# Patient Record
Sex: Male | Born: 1960 | Race: White | Hispanic: No | Marital: Married | Smoking: Former smoker
Health system: Southern US, Community
[De-identification: ages and names within clinical notes are randomized; demographics above are authoritative.]

## PROBLEM LIST (undated history)

## (undated) DIAGNOSIS — E785 Hyperlipidemia, unspecified: Secondary | ICD-10-CM

## (undated) DIAGNOSIS — K219 Gastro-esophageal reflux disease without esophagitis: Secondary | ICD-10-CM

## (undated) DIAGNOSIS — E669 Obesity, unspecified: Secondary | ICD-10-CM

## (undated) DIAGNOSIS — I2510 Atherosclerotic heart disease of native coronary artery without angina pectoris: Secondary | ICD-10-CM

## (undated) DIAGNOSIS — I10 Essential (primary) hypertension: Secondary | ICD-10-CM

## (undated) DIAGNOSIS — I219 Acute myocardial infarction, unspecified: Secondary | ICD-10-CM

## (undated) HISTORY — DX: Hyperlipidemia, unspecified: E78.5

## (undated) HISTORY — PX: CARDIAC CATHETERIZATION: SHX172

## (undated) HISTORY — DX: Essential (primary) hypertension: I10

## (undated) HISTORY — DX: Atherosclerotic heart disease of native coronary artery without angina pectoris: I25.10

## (undated) HISTORY — DX: Acute myocardial infarction, unspecified: I21.9

## (undated) HISTORY — DX: Gastro-esophageal reflux disease without esophagitis: K21.9

## (undated) HISTORY — PX: TONSILLECTOMY: SUR1361

## (undated) HISTORY — DX: Obesity, unspecified: E66.9

---

## 2011-11-01 ENCOUNTER — Ambulatory Visit: Payer: Self-pay | Admitting: Cardiology

## 2011-11-12 ENCOUNTER — Ambulatory Visit: Payer: Self-pay | Admitting: Cardiology

## 2011-11-29 ENCOUNTER — Ambulatory Visit: Payer: Self-pay | Admitting: Cardiology

## 2011-12-17 ENCOUNTER — Encounter: Payer: Self-pay | Admitting: Cardiology

## 2011-12-17 ENCOUNTER — Encounter: Payer: Self-pay | Admitting: *Deleted

## 2011-12-18 ENCOUNTER — Encounter: Payer: Self-pay | Admitting: Cardiology

## 2011-12-18 ENCOUNTER — Other Ambulatory Visit: Payer: Self-pay | Admitting: Cardiology

## 2011-12-18 ENCOUNTER — Ambulatory Visit (INDEPENDENT_AMBULATORY_CARE_PROVIDER_SITE_OTHER): Payer: Managed Care, Other (non HMO) | Admitting: Cardiology

## 2011-12-18 VITALS — BP 140/92 | HR 59 | Ht 68.0 in | Wt 192.0 lb

## 2011-12-18 DIAGNOSIS — I10 Essential (primary) hypertension: Secondary | ICD-10-CM | POA: Insufficient documentation

## 2011-12-18 DIAGNOSIS — E785 Hyperlipidemia, unspecified: Secondary | ICD-10-CM | POA: Insufficient documentation

## 2011-12-18 DIAGNOSIS — E7800 Pure hypercholesterolemia, unspecified: Secondary | ICD-10-CM

## 2011-12-18 DIAGNOSIS — I25810 Atherosclerosis of coronary artery bypass graft(s) without angina pectoris: Secondary | ICD-10-CM | POA: Insufficient documentation

## 2011-12-18 DIAGNOSIS — I2510 Atherosclerotic heart disease of native coronary artery without angina pectoris: Secondary | ICD-10-CM | POA: Insufficient documentation

## 2011-12-18 MED ORDER — ROSUVASTATIN CALCIUM 5 MG PO TABS: 5.0000 mg | ORAL_TABLET | Freq: Every day | ORAL | Status: DC

## 2011-12-18 MED ORDER — RAMIPRIL 5 MG PO CAPS: 5.0000 mg | ORAL_CAPSULE | Freq: Every day | ORAL | Status: DC

## 2011-12-18 NOTE — Assessment & Plan Note (Signed)
Continue statin. Check lipids and liver with goal LDL less than 70.

## 2011-12-18 NOTE — Patient Instructions (Addendum)
Your physician wants you to follow-up in: ONE YEAR WITH DR Jens Som IN [place] You will receive a reminder letter in the mail two months in advance. If you don't receive a letter, please call our office to schedule the follow-up appointment.   STOP PLAVIX  INCREASE RAMAPRIL TO 5 MG ONCE DAILY  Your physician recommends that you return for lab work in: ONE WEEK IN [place]=FASTING

## 2011-12-18 NOTE — Assessment & Plan Note (Signed)
Blood pressure elevated. Increase Altace to 5 mg daily. Check potassium and renal function in one week. Will also check hemoglobin A1c. We have referred to have a primary care physician in this area.

## 2011-12-18 NOTE — Assessment & Plan Note (Signed)
Plan continue aspirin and statin. It has been 5 years since his previous PCI. Discontinue his Plavix. Continue risk factor modification. Plan Myoview when he returns in one year.

## 2011-12-18 NOTE — Progress Notes (Signed)
  HPI: 51 year-old male for evaluation of coronary artery disease. Patient recently moved to this area from [place] and presents to establish. Patient is status post myocardial infarction in February of 2008. Cardiac catheterization at that time showed a normal left main, 30% proximal LAD, 50% mid lesion. The circumflex had a 95% proximal OM. The patient had primary PCI of his right coronary artery. He subsequently had PCI of the marginal. Last Myoview was performed in November of 2011. His ejection fraction was 75% and perfusion was normal. Echocardiogram in November of 2011 showed normal LV function, mild mitral and tricuspid regurgitation and trace aortic insufficiency. Patient denies exertional chest pain. He has dyspnea with more extreme activities but not routine activities. No orthopnea, PND or history of syncope.  Current Outpatient Prescriptions  Medication Sig Dispense Refill  . aspirin 81 MG tablet Take 81 mg by mouth daily.      . carvedilol (COREG) 6.25 MG tablet Take 6.25 mg by mouth 2 (two) times daily with a meal.      . clopidogrel (PLAVIX) 75 MG tablet Take 75 mg by mouth daily.      . nitroGLYCERIN (NITROSTAT) 0.4 MG SL tablet Place 0.4 mg under the tongue every 5 (five) minutes as needed.      . ramipril (ALTACE) 2.5 MG capsule Take 2.5 mg by mouth daily.      . rosuvastatin (CRESTOR) 5 MG tablet Take 5 mg by mouth daily.        No Known Allergies  Past Medical History  Diagnosis Date  . CAD (coronary artery disease)   . Myocardial infarction   . Hyperlipidemia   . Obesity   . GERD (gastroesophageal reflux disease)   . Hypertension     Past Surgical History  Procedure Date  . Cardiac catheterization   . Tonsillectomy     History   Social History  . Marital Status: Married    Spouse Name: N/A    Number of Children: 2  . Years of Education: N/A   Occupational History  .     Social History Main Topics  . Smoking status: Former Games developer  . Smokeless  tobacco: Not on file  . Alcohol Use: Yes     Occasional  . Drug Use: No  . Sexually Active: Not on file   Other Topics Concern  . Not on file   Social History Narrative  . No narrative on file    Family History  Problem Relation Age of Onset  . Coronary artery disease Mother     ROS: no fevers or chills, productive cough, hemoptysis, dysphasia, odynophagia, melena, hematochezia, dysuria, hematuria, rash, seizure activity, orthopnea, PND, pedal edema, claudication. Remaining systems are negative.  Physical Exam:  Blood pressure 140/92, pulse 59, height 5\' 8"  (1.727 m), weight 192 lb (87.091 kg).  General:  Well developed/well nourished in NAD Skin warm/dry Patient not depressed No peripheral clubbing Back-normal HEENT-normal/normal eyelids Neck supple/normal carotid upstroke bilaterally; no bruits; no JVD; no thyromegaly chest - CTA/ normal expansion CV - RRR/normal S1 and S2; no murmurs, rubs or gallops;  PMI nondisplaced Abdomen -NT/ND, no HSM, no mass, + bowel sounds, no bruit 2+ femoral pulses, no bruits Ext-no edema, chords, 2+ DP Neuro-grossly nonfocal  ECG sinus rhythm with occasional PVC. No ST changes.

## 2011-12-20 ENCOUNTER — Ambulatory Visit: Payer: Self-pay | Admitting: Cardiology

## 2012-01-25 LAB — BASIC METABOLIC PANEL WITH GFR
BUN: 15 mg/dL (ref 6–23)
Calcium: 9.3 mg/dL (ref 8.4–10.5)
Creat: 0.78 mg/dL (ref 0.50–1.35)
GFR, Est African American: >89 mL/min
GFR, Est Non African American: >89 mL/min

## 2012-01-25 LAB — HEMOGLOBIN A1C: Hgb A1c MFr Bld: 5.5 % (ref <5.7)

## 2012-01-25 LAB — HEPATIC FUNCTION PANEL
ALT: 19 U/L (ref 0–53)
AST: 22 U/L (ref 0–37)
Bilirubin, Direct: 0.1 mg/dL (ref 0.0–0.3)
Total Protein: 6.2 g/dL (ref 6.0–8.3)

## 2012-01-25 LAB — LIPID PANEL
Cholesterol: 172 mg/dL (ref 0–200)
Triglycerides: 87 mg/dL (ref <150)
VLDL: 17 mg/dL (ref 0–40)

## 2012-01-27 ENCOUNTER — Other Ambulatory Visit: Payer: Self-pay | Admitting: *Deleted

## 2012-01-27 ENCOUNTER — Telehealth: Payer: Self-pay | Admitting: Cardiology

## 2012-01-27 DIAGNOSIS — E7800 Pure hypercholesterolemia, unspecified: Secondary | ICD-10-CM

## 2012-01-27 MED ORDER — ROSUVASTATIN CALCIUM 40 MG PO TABS: 40.0000 mg | ORAL_TABLET | Freq: Every day | ORAL | Status: DC

## 2012-01-27 NOTE — Telephone Encounter (Signed)
Spoke with pt wife, aware of labs and med changes.

## 2012-01-27 NOTE — Telephone Encounter (Signed)
plz return call to pt [phone] regarding medical care

## 2012-02-10 ENCOUNTER — Telehealth: Payer: Self-pay | Admitting: Cardiology

## 2012-02-10 DIAGNOSIS — E7800 Pure hypercholesterolemia, unspecified: Secondary | ICD-10-CM

## 2012-02-10 NOTE — Telephone Encounter (Signed)
New problem:   Need clarification of dosage - crestor.

## 2012-02-11 ENCOUNTER — Telehealth: Payer: Self-pay | Admitting: Cardiology

## 2012-02-11 NOTE — Telephone Encounter (Signed)
Spoke with pt, per dr Jens Som this dosage is correct.

## 2012-02-11 NOTE — Telephone Encounter (Signed)
New Problem:    Patient called in because his Crestor went from 5mg  to 40mg  and wanted to know if it was the right dose.  Please call back.

## 2012-02-11 NOTE — Telephone Encounter (Signed)
Called pharmacy med was increased to  crestor 40 mg

## 2012-02-12 ENCOUNTER — Ambulatory Visit: Payer: Managed Care, Other (non HMO) | Admitting: Internal Medicine

## 2012-04-06 ENCOUNTER — Ambulatory Visit: Payer: Managed Care, Other (non HMO) | Admitting: Internal Medicine

## 2012-04-06 DIAGNOSIS — Z0289 Encounter for other administrative examinations: Secondary | ICD-10-CM

## 2012-05-22 ENCOUNTER — Other Ambulatory Visit: Payer: Self-pay | Admitting: *Deleted

## 2012-05-22 MED ORDER — CARVEDILOL 6.25 MG PO TABS: 6.2500 mg | ORAL_TABLET | Freq: Two times a day (BID) | ORAL | Status: DC

## 2013-01-10 ENCOUNTER — Other Ambulatory Visit: Payer: Self-pay | Admitting: Cardiology

## 2013-02-09 ENCOUNTER — Other Ambulatory Visit: Payer: Self-pay | Admitting: Cardiology

## 2013-02-24 ENCOUNTER — Encounter: Payer: Self-pay | Admitting: Cardiology

## 2013-02-24 ENCOUNTER — Ambulatory Visit (INDEPENDENT_AMBULATORY_CARE_PROVIDER_SITE_OTHER): Payer: Managed Care, Other (non HMO) | Admitting: Cardiology

## 2013-02-24 VITALS — BP 126/80 | HR 72 | Ht 69.0 in | Wt 205.0 lb

## 2013-02-24 DIAGNOSIS — I25810 Atherosclerosis of coronary artery bypass graft(s) without angina pectoris: Secondary | ICD-10-CM

## 2013-02-24 NOTE — Progress Notes (Signed)
      HPI: Fu coronary artery disease. Patient is status post myocardial infarction in February of 2008. Cardiac catheterization at that time showed a normal left main, 30% proximal LAD, 50% mid lesion. The circumflex had a 95% proximal OM. The patient had primary PCI of his right coronary artery. He subsequently had PCI of the marginal. Last Myoview was performed in November of 2011. His ejection fraction was 75% and perfusion was normal. Echocardiogram in November of 2011 showed normal LV function, mild mitral and tricuspid regurgitation and trace aortic insufficiency. Patient last seen in Sept 2013. Since then, the patient denies any dyspnea on exertion, orthopnea, PND, pedal edema, palpitations, syncope or chest pain.    Current Outpatient Prescriptions  Medication Sig Dispense Refill  . aspirin 81 MG tablet Take 81 mg by mouth daily.      . carvedilol (COREG) 6.25 MG tablet Take 1 tablet (6.25 mg total) by mouth 2 (two) times daily with a meal.  60 tablet  12  . CRESTOR 40 MG tablet TAKE 1 TABLET (40 MG TOTAL) BY MOUTH DAILY.  30 tablet  0  . nitroGLYCERIN (NITROSTAT) 0.4 MG SL tablet Place 0.4 mg under the tongue every 5 (five) minutes as needed.      . ramipril (ALTACE) 5 MG capsule TAKE 1 CAPSULE (5 MG TOTAL) BY MOUTH DAILY.  30 capsule  0   No current facility-administered medications for this visit.     Past Medical History  Diagnosis Date  . CAD (coronary artery disease)   . Myocardial infarction   . Hyperlipidemia   . Obesity   . GERD (gastroesophageal reflux disease)   . Hypertension     Past Surgical History  Procedure Laterality Date  . Cardiac catheterization    . Tonsillectomy      History   Social History  . Marital Status: Married    Spouse Name: N/A    Number of Children: 2  . Years of Education: N/A   Occupational History  .     Social History Main Topics  . Smoking status: Former Games developer  . Smokeless tobacco: Not on file  . Alcohol Use: Yes   Comment: Occasional  . Drug Use: No  . Sexual Activity: Not on file   Other Topics Concern  . Not on file   Social History Narrative  . No narrative on file    ROS: no fevers or chills, productive cough, hemoptysis, dysphasia, odynophagia, melena, hematochezia, dysuria, hematuria, rash, seizure activity, orthopnea, PND, pedal edema, claudication. Remaining systems are negative.  Physical Exam: Well-developed well-nourished in no acute distress.  Skin is warm and dry.  HEENT is normal.  Neck is supple.  Chest is clear to auscultation with normal expansion.  Cardiovascular exam is regular rate and rhythm.  Abdominal exam nontender or distended. No masses palpated. Extremities show no edema. neuro grossly intact  ECG sinus rhythm at a rate of 72. No ST changes.

## 2013-02-24 NOTE — Assessment & Plan Note (Signed)
Continue statin. Check lipids and liver. 

## 2013-02-24 NOTE — Assessment & Plan Note (Signed)
Blood pressure controlled. Continue present medications. Check potassium and renal function. I also referred him to primary care for his other medical needs.

## 2013-02-24 NOTE — Assessment & Plan Note (Signed)
Continue aspirin and statin.schedule stress echocardiogram for risk stratification. 

## 2013-02-24 NOTE — Patient Instructions (Signed)
Your physician wants you to follow-up in: ONE YEAR WITH DR Shelda Pal will receive a reminder letter in the mail two months in advance. If you don't receive a letter, please call our office to schedule the follow-up appointment.   Your physician has requested that you have a stress echocardiogram. For further information please visit [email or web address] Please follow instruction sheet as given.   Your physician recommends that you return for lab work WITH STRESS TEST

## 2013-03-12 ENCOUNTER — Other Ambulatory Visit: Payer: Self-pay | Admitting: Cardiology

## 2013-03-14 ENCOUNTER — Other Ambulatory Visit: Payer: Self-pay | Admitting: Cardiology

## 2013-04-02 ENCOUNTER — Other Ambulatory Visit (HOSPITAL_COMMUNITY): Payer: Managed Care, Other (non HMO)

## 2013-05-31 ENCOUNTER — Encounter: Payer: Self-pay | Admitting: Cardiology

## 2013-06-09 ENCOUNTER — Other Ambulatory Visit: Payer: Self-pay | Admitting: Cardiology

## 2013-06-23 ENCOUNTER — Telehealth: Payer: Self-pay | Admitting: Cardiology

## 2013-06-23 DIAGNOSIS — R079 Chest pain, unspecified: Secondary | ICD-10-CM

## 2013-06-23 NOTE — Telephone Encounter (Signed)
New message     Patient wife calling wanted to R/ S stress echo test that was cancel on  1/9 .  Need a new order submit

## 2013-06-23 NOTE — Telephone Encounter (Signed)
There is already an order placed. Please call pt to reschedule

## 2013-07-12 ENCOUNTER — Other Ambulatory Visit (HOSPITAL_COMMUNITY): Payer: Managed Care, Other (non HMO)

## 2013-10-18 ENCOUNTER — Telehealth (HOSPITAL_COMMUNITY): Payer: Self-pay | Admitting: *Deleted

## 2013-11-02 ENCOUNTER — Telehealth (HOSPITAL_COMMUNITY): Payer: Self-pay | Admitting: *Deleted

## 2013-11-03 ENCOUNTER — Telehealth (HOSPITAL_COMMUNITY): Payer: Self-pay | Admitting: *Deleted

## 2013-11-25 ENCOUNTER — Other Ambulatory Visit (HOSPITAL_COMMUNITY): Payer: Managed Care, Other (non HMO)

## 2013-12-16 ENCOUNTER — Ambulatory Visit (HOSPITAL_COMMUNITY): Payer: Managed Care, Other (non HMO) | Attending: Cardiology | Admitting: Radiology

## 2013-12-16 DIAGNOSIS — I10 Essential (primary) hypertension: Secondary | ICD-10-CM | POA: Diagnosis not present

## 2013-12-16 DIAGNOSIS — I2510 Atherosclerotic heart disease of native coronary artery without angina pectoris: Secondary | ICD-10-CM | POA: Diagnosis not present

## 2013-12-16 DIAGNOSIS — I25810 Atherosclerosis of coronary artery bypass graft(s) without angina pectoris: Secondary | ICD-10-CM

## 2013-12-16 DIAGNOSIS — E785 Hyperlipidemia, unspecified: Secondary | ICD-10-CM | POA: Insufficient documentation

## 2013-12-16 NOTE — Progress Notes (Signed)
Stress Echocardiogram performed.  

## 2013-12-17 ENCOUNTER — Encounter: Payer: Self-pay | Admitting: Cardiology

## 2013-12-17 ENCOUNTER — Telehealth: Payer: Self-pay | Admitting: Cardiology

## 2013-12-17 NOTE — Telephone Encounter (Signed)
Pt received a call  From somebody this morning concerning his test results.He said his wife took the call,he does not know who called.

## 2013-12-17 NOTE — Telephone Encounter (Signed)
New message    Wife calling patient had stress done on yesterday .   appt on 9/28 @ 2:45 pm.     Or should he wait until Oct  25

## 2013-12-17 NOTE — Telephone Encounter (Signed)
Spoke with pt wife, questions regarding test answered.

## 2013-12-17 NOTE — Telephone Encounter (Signed)
This encounter was created in error - please disregard.

## 2013-12-20 ENCOUNTER — Encounter: Payer: Self-pay | Admitting: Cardiology

## 2013-12-20 ENCOUNTER — Ambulatory Visit (INDEPENDENT_AMBULATORY_CARE_PROVIDER_SITE_OTHER): Payer: Managed Care, Other (non HMO) | Admitting: Cardiology

## 2013-12-20 VITALS — BP 130/80 | HR 62 | Ht 69.0 in | Wt 208.3 lb

## 2013-12-20 DIAGNOSIS — I10 Essential (primary) hypertension: Secondary | ICD-10-CM

## 2013-12-20 DIAGNOSIS — E785 Hyperlipidemia, unspecified: Secondary | ICD-10-CM

## 2013-12-20 DIAGNOSIS — I25810 Atherosclerosis of coronary artery bypass graft(s) without angina pectoris: Secondary | ICD-10-CM

## 2013-12-20 NOTE — Assessment & Plan Note (Signed)
Continue statin. Check lipids and liver. 

## 2013-12-20 NOTE — Patient Instructions (Signed)
Your physician wants you to follow-up in: ONE YEAR WITH DR CRENSHAW You will receive a reminder letter in the mail two months in advance. If you don't receive a letter, please call our office to schedule the follow-up appointment.   Your physician recommends that you return for lab work WHEN FASTING 

## 2013-12-20 NOTE — Progress Notes (Signed)
      HPI: Fu coronary artery disease. Patient is status post myocardial infarction in February of 2008. Cardiac catheterization at that time showed a normal left main, 30% proximal LAD, 50% mid lesion. The circumflex had a 95% proximal OM. The patient had primary PCI of his right coronary artery. He subsequently had PCI of the marginal. Last Myoview was performed in November of 2011. His ejection fraction was 75% and perfusion was normal. Echocardiogram in November of 2011 showed normal LV function, mild mitral and tricuspid regurgitation and trace aortic insufficiency. Stress echocardiogram September 2015 was technically difficult. No obvious wall motion abnormalities but ECG abnormal. Since last seen, the patient denies any dyspnea on exertion, orthopnea, PND, pedal edema, palpitations, syncope or chest pain. Occasional brief pain with stress lasting seconds.    Current Outpatient Prescriptions  Medication Sig Dispense Refill  . aspirin 81 MG tablet Take 81 mg by mouth daily.      . carvedilol (COREG) 6.25 MG tablet TAKE ONE TABLET BY MOUTH TWICE DAILY WITH MEALS  60 tablet  6  . CRESTOR 40 MG tablet TAKE 1 TABLET (40 MG TOTAL) BY MOUTH DAILY. NEED APPOINTMENT FOR MORE REFILLS  30 tablet  11  . nitroGLYCERIN (NITROSTAT) 0.4 MG SL tablet Place 0.4 mg under the tongue every 5 (five) minutes as needed.      . ramipril (ALTACE) 5 MG capsule TAKE 1 CAPSULE (5 MG TOTAL) BY MOUTH DAILY. NEED APPOINTMENT FOR MORE REFILLS  30 capsule  11   No current facility-administered medications for this visit.     Past Medical History  Diagnosis Date  . CAD (coronary artery disease)   . Myocardial infarction   . Hyperlipidemia   . Obesity   . GERD (gastroesophageal reflux disease)   . Hypertension     Past Surgical History  Procedure Laterality Date  . Cardiac catheterization    . Tonsillectomy      History   Social History  . Marital Status: Married    Spouse Name: N/A    Number of Children:  2  . Years of Education: N/A   Occupational History  .     Social History Main Topics  . Smoking status: Former Games developer  . Smokeless tobacco: Not on file  . Alcohol Use: Yes     Comment: Occasional  . Drug Use: No  . Sexual Activity: Not on file   Other Topics Concern  . Not on file   Social History Narrative  . No narrative on file    ROS: no fevers or chills, productive cough, hemoptysis, dysphasia, odynophagia, melena, hematochezia, dysuria, hematuria, rash, seizure activity, orthopnea, PND, pedal edema, claudication. Remaining systems are negative.  Physical Exam: Well-developed well-nourished in no acute distress.  Skin is warm and dry.  HEENT is normal.  Neck is supple.  Chest is clear to auscultation with normal expansion.  Cardiovascular exam is regular rate and rhythm.  Abdominal exam nontender or distended. No masses palpated. Extremities show no edema. neuro grossly intact  ECG Sinus rhythm at a rate of 62. No ST changes.

## 2013-12-20 NOTE — Assessment & Plan Note (Signed)
Blood pressure controlled. Continue present medications. Check potassium and renal function. 

## 2013-12-20 NOTE — Assessment & Plan Note (Signed)
Stress echocardiogram showed difficult images but no obvious abnormalities. It is not have exertional symptoms. Plan medical therapy. Continue aspirin and statin.

## 2014-04-11 ENCOUNTER — Other Ambulatory Visit: Payer: Self-pay | Admitting: Cardiology

## 2015-04-07 ENCOUNTER — Emergency Department (HOSPITAL_COMMUNITY)
Admission: EM | Admit: 2015-04-07 | Discharge: 2015-04-07 | Disposition: A | Discharge status: Home / Self Care | Payer: Managed Care, Other (non HMO) | Attending: Emergency Medicine | Admitting: Emergency Medicine

## 2015-04-07 ENCOUNTER — Encounter (HOSPITAL_COMMUNITY): Payer: Self-pay | Admitting: Emergency Medicine

## 2015-04-07 ENCOUNTER — Emergency Department (HOSPITAL_COMMUNITY): Payer: Managed Care, Other (non HMO)

## 2015-04-07 DIAGNOSIS — M79602 Pain in left arm: Secondary | ICD-10-CM | POA: Diagnosis not present

## 2015-04-07 DIAGNOSIS — I2510 Atherosclerotic heart disease of native coronary artery without angina pectoris: Secondary | ICD-10-CM | POA: Diagnosis not present

## 2015-04-07 DIAGNOSIS — I252 Old myocardial infarction: Secondary | ICD-10-CM | POA: Insufficient documentation

## 2015-04-07 DIAGNOSIS — E669 Obesity, unspecified: Secondary | ICD-10-CM | POA: Diagnosis not present

## 2015-04-07 DIAGNOSIS — Z7982 Long term (current) use of aspirin: Secondary | ICD-10-CM | POA: Insufficient documentation

## 2015-04-07 DIAGNOSIS — R202 Paresthesia of skin: Secondary | ICD-10-CM | POA: Diagnosis present

## 2015-04-07 DIAGNOSIS — E785 Hyperlipidemia, unspecified: Secondary | ICD-10-CM | POA: Diagnosis not present

## 2015-04-07 DIAGNOSIS — Z8719 Personal history of other diseases of the digestive system: Secondary | ICD-10-CM | POA: Diagnosis not present

## 2015-04-07 DIAGNOSIS — Z79899 Other long term (current) drug therapy: Secondary | ICD-10-CM | POA: Insufficient documentation

## 2015-04-07 DIAGNOSIS — I10 Essential (primary) hypertension: Secondary | ICD-10-CM | POA: Diagnosis not present

## 2015-04-07 LAB — BASIC METABOLIC PANEL
Anion gap: 12 (ref 5–15)
BUN: 14 mg/dL (ref 6–20)
CHLORIDE: 103 mmol/L (ref 101–111)
CO2: 27 mmol/L (ref 22–32)
CREATININE: 0.93 mg/dL (ref 0.61–1.24)
Calcium: 9.6 mg/dL (ref 8.9–10.3)
Glucose, Bld: 126 mg/dL — ABNORMAL HIGH (ref 65–99)
POTASSIUM: 3.9 mmol/L (ref 3.5–5.1)
SODIUM: 142 mmol/L (ref 135–145)

## 2015-04-07 LAB — CBC
HCT: 42.2 % (ref 39.0–52.0)
Hemoglobin: 14.5 g/dL (ref 13.0–17.0)
MCH: 28.2 pg (ref 26.0–34.0)
MCHC: 34.4 g/dL (ref 30.0–36.0)
MCV: 81.9 fL (ref 78.0–100.0)
Platelets: 217 10*3/uL (ref 150–400)
RBC: 5.15 MIL/uL (ref 4.22–5.81)
RDW: 13.7 % (ref 11.5–15.5)
WBC: 9.1 10*3/uL (ref 4.0–10.5)

## 2015-04-07 LAB — I-STAT TROPONIN, ED: Troponin i, poc: 0.01 ng/mL (ref 0.00–0.08)

## 2015-04-07 LAB — TROPONIN I

## 2015-04-07 NOTE — ED Provider Notes (Signed)
CSN: [id number]     Arrival date & time 04/07/15  0407 History   First MD Initiated Contact with Patient 04/07/15 [phone]     Chief Complaint  Patient presents with  . Chest Pain  . Arm Pain     (Consider location/radiation/quality/duration/timing/severity/associated sxs/prior Treatment) Patient is a 55 y.o. male presenting with chest pain and arm pain. The history is provided by the patient (Patient states that he woke up at 2 AM this morning had some left arm pain may be some left chest pain took a nitroglycerin and the pain went away after 2 hours.).  Chest Pain Pain location:  L chest Pain quality: aching   Pain radiates to:  Does not radiate Pain radiates to the back: no   Pain severity:  No pain Onset quality:  Sudden Timing:  Intermittent Progression:  Resolved Chronicity:  New Context: not breathing   Associated symptoms: no abdominal pain, no back pain, no cough, no fatigue and no headache   Arm Pain Associated symptoms include chest pain. Pertinent negatives include no abdominal pain and no headaches.    Past Medical History  Diagnosis Date  . CAD (coronary artery disease)   . Myocardial infarction (HCC)   . Hyperlipidemia   . Obesity   . GERD (gastroesophageal reflux disease)   . Hypertension    Past Surgical History  Procedure Laterality Date  . Cardiac catheterization    . Tonsillectomy     Family History  Problem Relation Age of Onset  . Coronary artery disease Mother    Social History  Substance Use Topics  . Smoking status: Former Games developer  . Smokeless tobacco: None  . Alcohol Use: Yes     Comment: Occasional    Review of Systems  Constitutional: Negative for appetite change and fatigue.  HENT: Negative for congestion, ear discharge and sinus pressure.   Eyes: Negative for discharge.  Respiratory: Negative for cough.   Cardiovascular: Positive for chest pain.  Gastrointestinal: Negative for abdominal pain and diarrhea.  Genitourinary: Negative  for frequency and hematuria.  Musculoskeletal: Negative for back pain.  Skin: Negative for rash.  Neurological: Negative for seizures and headaches.  Psychiatric/Behavioral: Negative for hallucinations.      Allergies  Review of patient's allergies indicates no known allergies.  Home Medications   Prior to Admission medications   Medication Sig Start Date End Date Taking? Authorizing Provider  aspirin 81 MG tablet Take 81 mg by mouth daily.   Yes Historical Provider, MD  carvedilol (COREG) 6.25 MG tablet TAKE ONE TABLET BY MOUTH TWICE DAILY WITH MEALS 06/09/13  Yes Lewayne Bunting, MD  CRESTOR 40 MG tablet TAKE 1 TABLET (40 MG TOTAL) BY MOUTH DAILY. 04/12/14  Yes Lewayne Bunting, MD  escitalopram (LEXAPRO) 10 MG tablet Take 10 mg by mouth daily.   Yes Historical Provider, MD  Multiple Vitamin (MULTIVITAMIN WITH MINERALS) TABS tablet Take 1 tablet by mouth daily.   Yes Historical Provider, MD  omega-3 acid ethyl esters (LOVAZA) 1 g capsule Take 1 g by mouth daily.   Yes Historical Provider, MD  ramipril (ALTACE) 5 MG capsule TAKE 1 CAPSULE (5 MG TOTAL) BY MOUTH DAILY. NEED APPOINTMENT FOR MORE REFILLS 03/14/13  Yes Lewayne Bunting, MD  nitroGLYCERIN (NITROSTAT) 0.4 MG SL tablet Place 0.4 mg under the tongue every 5 (five) minutes as needed.    Historical Provider, MD   BP 148/94 mmHg  Pulse 63  Temp(Src) 97.8 F (36.6 C) (Oral)  Resp 18  Ht 5\' 8"  (1.727 m)  Wt 192 lb (87.091 kg)  BMI 29.20 kg/m2  SpO2 99% Physical Exam  Constitutional: He is oriented to person, place, and time. He appears well-developed.  HENT:  Head: Normocephalic.  Eyes: Conjunctivae and EOM are normal. No scleral icterus.  Neck: Neck supple. No thyromegaly present.  Cardiovascular: Normal rate and regular rhythm.  Exam reveals no gallop and no friction rub.   No murmur heard. Pulmonary/Chest: No stridor. He has no wheezes. He has no rales. He exhibits no tenderness.  Abdominal: He exhibits no distension.  There is no tenderness. There is no rebound.  Musculoskeletal: Normal range of motion. He exhibits no edema.  Lymphadenopathy:    He has no cervical adenopathy.  Neurological: He is oriented to person, place, and time. He exhibits normal muscle tone. Coordination normal.  Skin: No rash noted. No erythema.  Psychiatric: He has a normal mood and affect. His behavior is normal.    ED Course  Procedures (including critical care time) Labs Review Labs Reviewed  BASIC METABOLIC PANEL - Abnormal; Notable for the following:    Glucose, Bld 126 (*)    All other components within normal limits  CBC  TROPONIN I  Rosezena SensorI-STAT TROPOININ, ED    Imaging Review Dg Chest 2 View  04/07/2015  CLINICAL DATA:  55 year old male with chest pain and left arm pain. EXAM: CHEST  2 VIEW COMPARISON:  None. FINDINGS: The heart size and mediastinal contours are within normal limits. Both lungs are clear. The visualized skeletal structures are unremarkable. IMPRESSION: No active cardiopulmonary disease. Electronically Signed   By: Elgie CollardArash  Radparvar M.D.   On: 04/07/2015 06:17   I have personally reviewed and evaluated these images and lab results as part of my medical decision-making.   EKG Interpretation   Date/Time:  Friday April 07 2015 04:07:35 EST Ventricular Rate:  68 PR Interval:  164 QRS Duration: 96 QT Interval:  412 QTC Calculation: 438 R Axis:   45 Text Interpretation:  Normal sinus rhythm Normal ECG Confirmed by Pruitt Taboada   MD, Karelly Dewalt ([id number]) on 04/07/2015 7:08:19 AM      MDM   Final diagnoses:  Pain of left upper extremity    Patient was seen by cardiology it was felt that his discomfort was not cardiac related he will be followed up at the cardiology clinic. Arm pain probably secondary to musculoskeletal injury from changing tires the day before    Bethann BerkshireJoseph Fard Borunda, MD 04/07/15 1054

## 2015-04-07 NOTE — H&P (Signed)
Cardiologist: Aaron Blackburn is an 55 y.o. male.   Chief Complaint: Chest Pain HPI:   Patient is a 55 year old male with history of coronary disease, hyperlipidemia, obesity, GERD, hypertension.  We saw him last in the clinic September 2015.  He is status post myocardial infarction in February of 2008. Cardiac catheterization at that time showed a normal left main, 30% proximal LAD, 50% mid lesion. The circumflex had a 95% proximal OM. The patient had primary PCI of his right coronary artery. He subsequently had PCI of the marginal. Last Myoview was performed in November of 2011. His ejection fraction was 75% and perfusion was normal. Echocardiogram in November of 2011 showed normal LV function, mild mitral and tricuspid regurgitation and trace aortic insufficiency. Stress echocardiogram September 2015 was technically difficult. No obvious wall motion abnormalities but ECG abnormal.  Aaron Blackburn presents with Paresthesia and numbness in his left arm.  Patient reports he was already awake at 0200 hrs. this morning and noticed he was having numbness and tingling in the left upper extremity. He denies having chest pain. The sensation resolved approximately 30 minutes later. Reports laying on the ground yesterday having to change a tire. 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 Troponins negative 2 in no acute EKG changes.   Past Medical History  Diagnosis Date  . CAD (coronary artery disease)   . Myocardial infarction ([place])   . Hyperlipidemia   . Obesity   . GERD (gastroesophageal reflux disease)   . Hypertension     Past Surgical History  Procedure Laterality Date  . Cardiac catheterization    . Tonsillectomy      Family History  Problem Relation Age of Onset  . Coronary artery disease Mother    Social History:  reports that he has quit smoking. He does not have any smokeless tobacco history on file. He reports that he drinks alcohol. He reports that he  does not use illicit drugs.  Allergies: No Known Allergies   (Not in a hospital admission)  Results for orders placed or performed during the hospital encounter of 04/07/15 (from the past 48 hour(s))  Basic metabolic panel     Status: Abnormal   Collection Time: 04/07/15  4:20 AM  Result Value Ref Range   Sodium 142 135 - 145 mmol/L   Potassium 3.9 3.5 - 5.1 mmol/L   Chloride 103 101 - 111 mmol/L   CO2 27 22 - 32 mmol/L   Glucose, Bld 126 (H) 65 - 99 mg/dL   BUN 14 6 - 20 mg/dL   Creatinine, Ser 0.93 0.61 - 1.24 mg/dL   Calcium 9.6 8.9 - 10.3 mg/dL   GFR calc non Af Amer >60 >60 mL/min   GFR calc Af Amer >60 >60 mL/min    Comment: (NOTE) The eGFR has been calculated using the CKD EPI equation. This calculation has not been validated in all clinical situations. eGFR's persistently <60 mL/min signify possible Chronic Kidney Disease.    Anion gap 12 5 - 15  CBC     Status: None   Collection Time: 04/07/15  4:20 AM  Result Value Ref Range   WBC 9.1 4.0 - 10.5 K/uL   RBC 5.15 4.22 - 5.81 MIL/uL   Hemoglobin 14.5 13.0 - 17.0 g/dL   HCT 42.2 39.0 - 52.0 %   MCV 81.9 78.0 - 100.0 fL   MCH 28.2 26.0 - 34.0 pg   MCHC 34.4 30.0 - 36.0 g/dL  RDW 13.7 11.5 - 15.5 %   Platelets 217 150 - 400 K/uL  I-stat troponin, ED (not at [facility])     Status: None   Collection Time: 04/07/15  4:26 AM  Result Value Ref Range   Troponin i, poc 0.01 0.00 - 0.08 ng/mL   Comment 3            Comment: Due to the release kinetics of cTnI, a negative result within the first hours of the onset of symptoms does not rule out myocardial infarction with certainty. If myocardial infarction is still suspected, repeat the test at appropriate intervals.   Troponin I     Status: None   Collection Time: 04/07/15  7:26 AM  Result Value Ref Range   Troponin I <0.03 <0.031 ng/mL    Comment:        NO INDICATION OF MYOCARDIAL INJURY.    Dg Chest 2 View  04/07/2015  CLINICAL DATA:  55 year old male with  chest pain and left arm pain. EXAM: CHEST  2 VIEW COMPARISON:  None. FINDINGS: The heart size and mediastinal contours are within normal limits. Both lungs are clear. The visualized skeletal structures are unremarkable. IMPRESSION: No active cardiopulmonary disease. Electronically Signed   By: Anner Crete M.D.   On: 04/07/2015 06:17    Review of Systems  Constitutional: Negative for fever.  HENT: Negative for congestion and sore throat.   Respiratory: Negative for cough and shortness of breath.   Cardiovascular: Negative for chest pain, orthopnea, leg swelling and PND.  Gastrointestinal: Negative for nausea, vomiting, abdominal pain, blood in stool and melena.  Neurological: Positive for dizziness and tingling. Negative for weakness.       Numbness left arm  All other systems reviewed and are negative.   Blood pressure 148/94, pulse 63, temperature 97.8 F (36.6 C), temperature source Oral, resp. rate 18, height _0  (1.727 m), weight 192 lb (87.091 kg), SpO2 99 %. Physical Exam  Nursing note and vitals reviewed. Constitutional: He is oriented to person, place, and time. He appears well-developed and well-nourished. No distress.  HENT:  Head: Normocephalic and atraumatic.  Eyes: EOM are normal. Pupils are equal, round, and reactive to light. No scleral icterus.  Neck: Normal range of motion. Neck supple.  Cardiovascular: Normal rate, regular rhythm, S1 normal and S2 normal.   No murmur heard. Pulses:      Radial pulses are 2+ on the right side, and 2+ on the left side.       Dorsalis pedis pulses are 2+ on the right side, and 2+ on the left side.  Respiratory: Effort normal and breath sounds normal. He has no wheezes. He has no rales.  GI: Soft. Bowel sounds are normal. There is no tenderness.  Musculoskeletal: He exhibits no edema or tenderness.  No cervical spine tenderness.  Lymphadenopathy:    He has no cervical adenopathy.  Neurological: He is alert and oriented to  person, place, and time. He exhibits normal muscle tone.  Strength 5 out of 5 in the upper extremities.  Skin: Skin is warm and dry.  Psychiatric: He has a normal mood and affect.     Assessment/Plan Active Problems:   Paresthesia of left arm   55 year old male with history of coronary disease, hyperlipidemia, obesity, GERD, hypertension.  Presents with paresthesia and numbness in the left arm.  He did tell the ER doc was having mild left lateral chest pain.  EKG was normal.  Troponins negative 2. This  is noncardiac in nature.  This could just be the aftereffects of changing in a tire and lying on the ground along with a history of cervical spine osteoarthritis.  Symptoms have resolved. Will discharge home from the ER. They've arranged appointment with Dr. Stanford Breed on March 14 at 3:00.  The patient will call the office if he needs to be seen any sooner.  Tarri Fuller, [place] 04/07/2015, 10:21 AM

## 2015-04-07 NOTE — Discharge Instructions (Signed)
Follow-up with your cardiologist next week as planned 

## 2015-04-07 NOTE — ED Notes (Signed)
Patient with neck pain, shoulder pain and left arm pain.  Patient states that it went away after taking one nitro, then had a heaviness feeling.  Patient states that when he had his MI, he had the numbness in his neck and numbness in left arm.  Patient is CAOx4 in triage.

## 2015-05-30 NOTE — Progress Notes (Signed)
      HPI: Fu coronary artery disease. Patient is status post myocardial infarction in February of 2008. Cardiac catheterization at that time showed a normal left main, 30% proximal LAD, 50% mid lesion. The circumflex had a 95% proximal OM. The patient had primary PCI of his right coronary artery. He subsequently had PCI of the marginal. Last Myoview was performed in November of 2011. His ejection fraction was 75% and perfusion was normal. Echocardiogram in November of 2011 showed normal LV function, mild mitral and tricuspid regurgitation and trace aortic insufficiency. Stress echocardiogram September 2015 was technically difficult. No obvious wall motion abnormalities but ECG abnormal. Patient seen in the emergency room 2017 with left upper extremity paresthesia. Troponin negative. Since last seen, the patient denies any dyspnea on exertion, orthopnea, PND, pedal edema, palpitations, syncope or chest pain.   Current Outpatient Prescriptions  Medication Sig Dispense Refill  . aspirin 81 MG tablet Take 81 mg by mouth daily.    . carvedilol (COREG) 6.25 MG tablet TAKE ONE TABLET BY MOUTH TWICE DAILY WITH MEALS 60 tablet 6  . CRESTOR 40 MG tablet TAKE 1 TABLET (40 MG TOTAL) BY MOUTH DAILY. 30 tablet 10  . escitalopram (LEXAPRO) 10 MG tablet Take 10 mg by mouth daily.    . Multiple Vitamin (MULTIVITAMIN WITH MINERALS) TABS tablet Take 1 tablet by mouth daily.    . nitroGLYCERIN (NITROSTAT) 0.4 MG SL tablet Place 0.4 mg under the tongue every 5 (five) minutes as needed.    Marland Kitchen. omega-3 acid ethyl esters (LOVAZA) 1 g capsule Take 1 g by mouth daily.    . ramipril (ALTACE) 5 MG capsule TAKE 1 CAPSULE (5 MG TOTAL) BY MOUTH DAILY. NEED APPOINTMENT FOR MORE REFILLS 30 capsule 11   No current facility-administered medications for this visit.     Past Medical History  Diagnosis Date  . CAD (coronary artery disease)   . Myocardial infarction ([facility])   . Hyperlipidemia   . Obesity   . GERD  (gastroesophageal reflux disease)   . Hypertension     Past Surgical History  Procedure Laterality Date  . Cardiac catheterization    . Tonsillectomy      Social History   Social History  . Marital Status: Married    Spouse Name: N/A  . Number of Children: 2  . Years of Education: N/A   Occupational History  .     Social History Main Topics  . Smoking status: Former Games developermoker  . Smokeless tobacco: Not on file  . Alcohol Use: Yes     Comment: Occasional  . Drug Use: No  . Sexual Activity: Not on file   Other Topics Concern  . Not on file   Social History Narrative    Family History  Problem Relation Age of Onset  . Coronary artery disease Mother     ROS: no fevers or chills, productive cough, hemoptysis, dysphasia, odynophagia, melena, hematochezia, dysuria, hematuria, rash, seizure activity, orthopnea, PND, pedal edema, claudication. Remaining systems are negative.  Physical Exam: Well-developed well-nourished in no acute distress.  Skin is warm and dry.  HEENT is normal.  Neck is supple.  Chest is clear to auscultation with normal expansion.  Cardiovascular exam is regular rate and rhythm.  Abdominal exam nontender or distended. No masses palpated. Extremities show no edema. neuro grossly intact  ECG Sinus rhythm at a rate of 62. No ST changes.

## 2015-06-06 ENCOUNTER — Ambulatory Visit (INDEPENDENT_AMBULATORY_CARE_PROVIDER_SITE_OTHER): Payer: Managed Care, Other (non HMO) | Admitting: Cardiology

## 2015-06-06 ENCOUNTER — Encounter: Payer: Self-pay | Admitting: Cardiology

## 2015-06-06 VITALS — BP 148/102 | HR 60 | Ht 68.0 in | Wt 195.0 lb

## 2015-06-06 DIAGNOSIS — E785 Hyperlipidemia, unspecified: Secondary | ICD-10-CM | POA: Diagnosis not present

## 2015-06-06 MED ORDER — RAMIPRIL 10 MG PO CAPS: 10.0000 mg | ORAL_CAPSULE | Freq: Every day | ORAL | Status: DC

## 2015-06-06 NOTE — Assessment & Plan Note (Signed)
Continue statin. Check lipids and liver. 

## 2015-06-06 NOTE — Patient Instructions (Signed)
Medication Instructions:   INCREASE RAMAPRIL TO 10 MG ONCE DAILY= 2 OF THE 5 MG TABLETS ONCE DAILY  Labwork:  Your physician recommends that you return for lab work in: ONE WEEK=DO NOT EAT PRIOR TO LAB WORK  Follow-Up:  Your physician wants you to follow-up in: ONE YEAR WITH DR Shelda PalRENSHAW You will receive a reminder letter in the mail two months in advance. If you don't receive a letter, please call our office to schedule the follow-up appointment.

## 2015-06-06 NOTE — Assessment & Plan Note (Signed)
Continue aspirin and statin. 

## 2015-06-06 NOTE — Assessment & Plan Note (Signed)
Blood pressure elevated. Increase Altace to 10 mg daily. In 1 week check potassium and renal function. Monitor blood pressure at home and advance medications as needed.

## 2015-06-16 ENCOUNTER — Telehealth: Payer: Self-pay | Admitting: Cardiology

## 2015-06-16 ENCOUNTER — Telehealth: Payer: Self-pay | Admitting: General Practice

## 2015-06-16 NOTE — Telephone Encounter (Signed)
No answer

## 2015-06-16 NOTE — Telephone Encounter (Signed)
Duplicate encounter

## 2015-06-16 NOTE — Telephone Encounter (Signed)
Pt's wife is calling in to give BP readings. 2 of them are from the home device that purchased and one is from the monitor at [facility] that he [facility].    3/21 126/86 HT  3/23-156/103 home device          147/103

## 2015-06-21 NOTE — Telephone Encounter (Signed)
Pt wife stated that pt has been checking BP at [facility] and it has been more normal and along with his home machine readings. Pt wants to cancel appt for Friday said she will fax this weeks reading to the office to review and will setup appt is finds there are abnormal readings in future.

## 2015-06-21 NOTE — Telephone Encounter (Signed)
New message      Pt is due to come in Friday 06-23-15 to see the nurse and bring his bp machine.  Wife says he has been checking his bp at home and comparing it to the bp machine at [facility] and it is the same.  Should he keep his Friday appt?

## 2015-06-22 ENCOUNTER — Other Ambulatory Visit: Payer: Self-pay | Admitting: *Deleted

## 2015-06-22 ENCOUNTER — Telehealth: Payer: Self-pay | Admitting: Cardiology

## 2015-06-22 MED ORDER — RAMIPRIL 10 MG PO CAPS: 10.0000 mg | ORAL_CAPSULE | Freq: Every day | ORAL | Status: DC

## 2015-06-22 MED ORDER — RAMIPRIL 10 MG PO CAPS: 10.0000 mg | ORAL_CAPSULE | Freq: Two times a day (BID) | ORAL | Status: DC

## 2015-06-22 NOTE — Telephone Encounter (Signed)
Patients wife stated that the pharmacy never received the ramipril rx for the new dose.

## 2015-06-22 NOTE — Telephone Encounter (Signed)
REFILL 

## 2015-06-22 NOTE — Telephone Encounter (Signed)
90 capsule 3 06/22/2015   Refilled by C. Clinton SawyerWilliamson, CMA

## 2015-06-22 NOTE — Telephone Encounter (Signed)
°*  STAT* If patient is at the pharmacy, call can be transferred to refill team.   1. Which medications need to be refilled? (please list name of each medication and dose if known) Rampril  10mg  -- Needs a new Prescription   2. Which pharmacy/location (including street and city if local pharmacy) is medication to be sent to?[facility] on [address]   3. Do they need a 30 day or 90 day supply? 90

## 2015-06-23 ENCOUNTER — Ambulatory Visit: Payer: Managed Care, Other (non HMO)

## 2015-06-23 ENCOUNTER — Other Ambulatory Visit: Payer: Self-pay | Admitting: *Deleted

## 2015-06-23 MED ORDER — RAMIPRIL 10 MG PO CAPS: 10.0000 mg | ORAL_CAPSULE | Freq: Two times a day (BID) | ORAL | Status: DC

## 2015-06-28 ENCOUNTER — Telehealth: Payer: Self-pay | Admitting: Cardiology

## 2015-06-28 NOTE — Telephone Encounter (Signed)
Pt's wife faxed BP readings on Monday  on 06-26-15  , did you receive? And plan from here?  [phone]

## 2015-06-28 NOTE — Telephone Encounter (Signed)
Left message for pt to call.

## 2015-06-29 NOTE — Telephone Encounter (Signed)
Pt's wife is calling back to speaking to a nurse about the pt's blood pressure. She says that she faxed over his readings on Monday and has not heard anything

## 2015-06-29 NOTE — Telephone Encounter (Signed)
Follow Up ° °Pt returned call//  °

## 2015-06-29 NOTE — Telephone Encounter (Signed)
Follow up ° ° ° ° ° °Returning a call to the nurse °

## 2015-06-29 NOTE — Telephone Encounter (Signed)
Returned call to patient's wife.She stated she faxed husband's B/P readings on 06/26/15 for Dr.Crenshaw's review.She was calling back to see if he had reviewed readings.Advised Dr.Crenshaw out of office.I will send message to his nurse Stanton KidneyDebra.She stated they will be traveling tomorrow please call her cell phone [phone].

## 2015-06-30 MED ORDER — CARVEDILOL 12.5 MG PO TABS: 12.5000 mg | ORAL_TABLET | Freq: Two times a day (BID) | ORAL | Status: DC

## 2015-06-30 NOTE — Telephone Encounter (Signed)
Change coreg to 12.5 BID Finneas Mathe  

## 2015-06-30 NOTE — Telephone Encounter (Signed)
Follow up      Patient returning the call back to nurse

## 2015-06-30 NOTE — Telephone Encounter (Signed)
Spoke with pt, Aware of dr crenshaw's recommendations. New script sent to the pharmacy  

## 2015-06-30 NOTE — Telephone Encounter (Signed)
Spoke with pt, his bp is running [phone] for the last 2 weeks.  Medications confirmed. Will forward for dr Jens Somcrenshaw review

## 2015-07-11 ENCOUNTER — Encounter: Payer: Self-pay | Admitting: *Deleted

## 2015-07-11 LAB — LIPID PANEL
CHOL/HDL RATIO: 4 ratio (ref <=5.0)
CHOLESTEROL: 109 mg/dL — AB (ref 125–200)
HDL: 27 mg/dL — ABNORMAL LOW (ref >=40)
LDL CALC: 59 mg/dL (ref <130)
Triglycerides: 117 mg/dL (ref <150)
VLDL: 23 mg/dL (ref <30)

## 2015-07-11 LAB — HEPATIC FUNCTION PANEL
ALT: 15 U/L (ref 9–46)
AST: 21 U/L (ref 10–35)
Albumin: 4.4 g/dL (ref 3.6–5.1)
Alkaline Phosphatase: 56 U/L (ref 40–115)
BILIRUBIN DIRECT: 0.1 mg/dL (ref <=0.2)
BILIRUBIN INDIRECT: 0.4 mg/dL (ref 0.2–1.2)
BILIRUBIN TOTAL: 0.5 mg/dL (ref 0.2–1.2)
Total Protein: 6.1 g/dL (ref 6.1–8.1)

## 2015-07-11 LAB — BASIC METABOLIC PANEL
BUN: 14 mg/dL (ref 7–25)
CHLORIDE: 103 mmol/L (ref 98–110)
CO2: 28 mmol/L (ref 20–31)
Calcium: 9.2 mg/dL (ref 8.6–10.3)
Creat: 0.86 mg/dL (ref 0.70–1.33)
GLUCOSE: 81 mg/dL (ref 65–99)
POTASSIUM: 4.4 mmol/L (ref 3.5–5.3)
SODIUM: 140 mmol/L (ref 135–146)

## 2015-07-14 ENCOUNTER — Telehealth: Payer: Self-pay | Admitting: Cardiology

## 2015-07-14 NOTE — Telephone Encounter (Signed)
Spoke with pt's wife, ok per DPR. Told her the labs were "ok" per Dr. Jens Somrenshaw. Told her letter with pt results has been mailed. Labs sent via EPIC to Dr. Sena SlateBadger,PCP, as requested per [facility].

## 2015-07-14 NOTE — Telephone Encounter (Signed)
alling to get lab results and is asking that the lab results be faxed over to Dr.Badger Office .Marland Kitchen.  Please call .Marland Kitchen.Marland Kitchen.Thanks

## 2016-02-05 ENCOUNTER — Other Ambulatory Visit: Payer: Self-pay | Admitting: Cardiology

## 2016-02-05 ENCOUNTER — Other Ambulatory Visit: Payer: Self-pay | Admitting: *Deleted

## 2016-02-05 MED ORDER — ROSUVASTATIN CALCIUM 40 MG PO TABS: ORAL_TABLET | ORAL | 5 refills | Status: DC

## 2016-02-05 NOTE — Telephone Encounter (Signed)
°*  STAT* If patient is at the pharmacy, call can be transferred to refill team.   1. Which medications need to be refilled? (please list name of each medication and dose if known) [employer] company will not pay for the generic Crestor 2. Which pharmacy/location (including street and city if local pharmacy) is medication to be sent to?[facility]  3. Do they need a 30 day or 90 day supply? 30 days and refills

## 2016-02-05 NOTE — Telephone Encounter (Signed)
F/u Message  Pt call to f/u on med refill. Please call back to discuss

## 2016-02-06 MED ORDER — CRESTOR 40 MG PO TABS: ORAL_TABLET | ORAL | 5 refills | Status: DC

## 2016-02-06 NOTE — Telephone Encounter (Signed)
Ok for pt to take generic crestor, spoke to pt's wife,

## 2016-04-30 ENCOUNTER — Other Ambulatory Visit: Payer: Self-pay

## 2016-04-30 MED ORDER — CARVEDILOL 12.5 MG PO TABS: 12.5000 mg | ORAL_TABLET | Freq: Two times a day (BID) | ORAL | 0 refills | Status: DC

## 2016-06-16 ENCOUNTER — Other Ambulatory Visit: Payer: Self-pay | Admitting: Cardiology

## 2017-01-31 IMAGING — CR DG CHEST 2V
2 series · 2 of 2 positions shown · non-contrast
Comparison: None.

CLINICAL DATA: 54-year-old male with chest pain and left arm pain.

EXAM:
CHEST  2 VIEW

[chest pa]
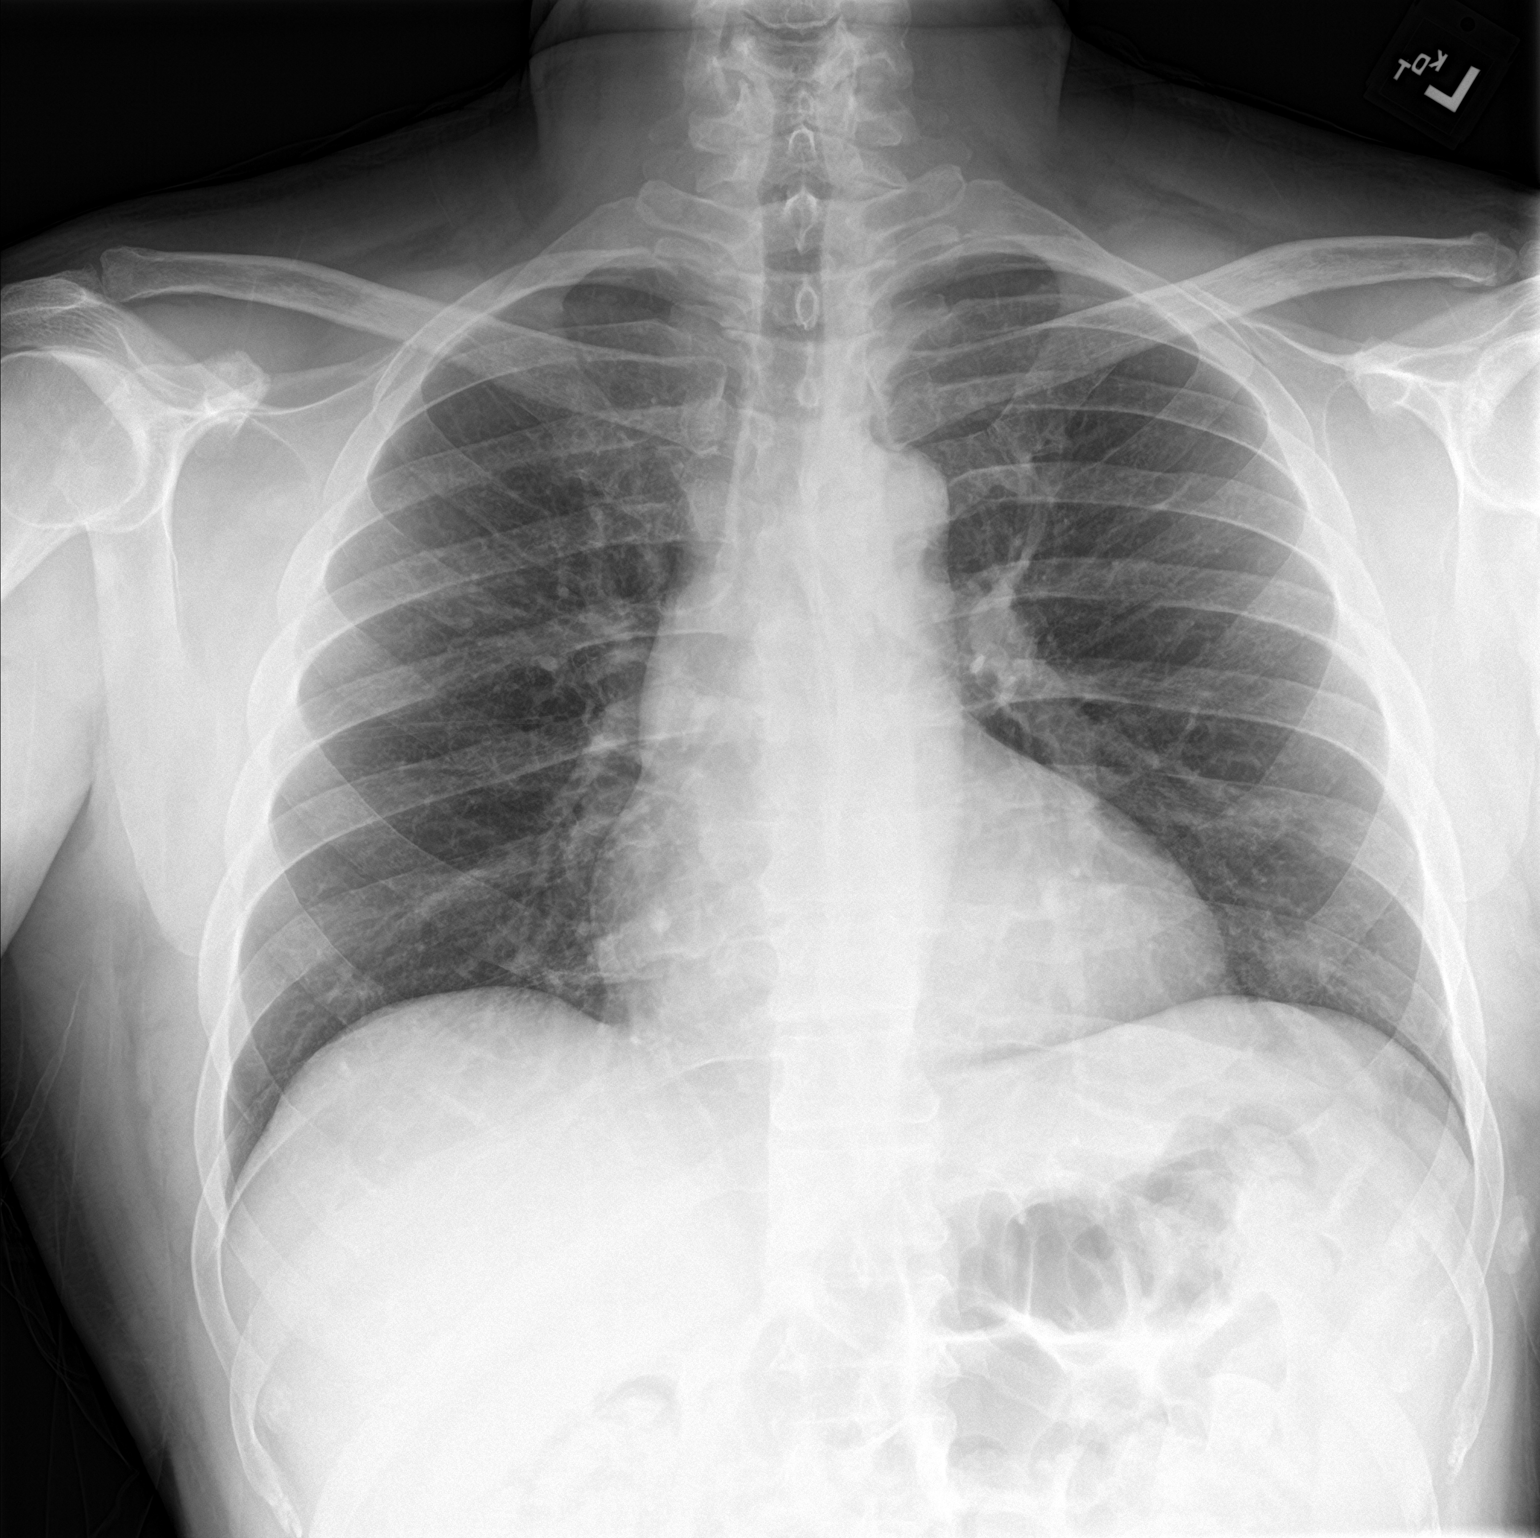

[chest lat]
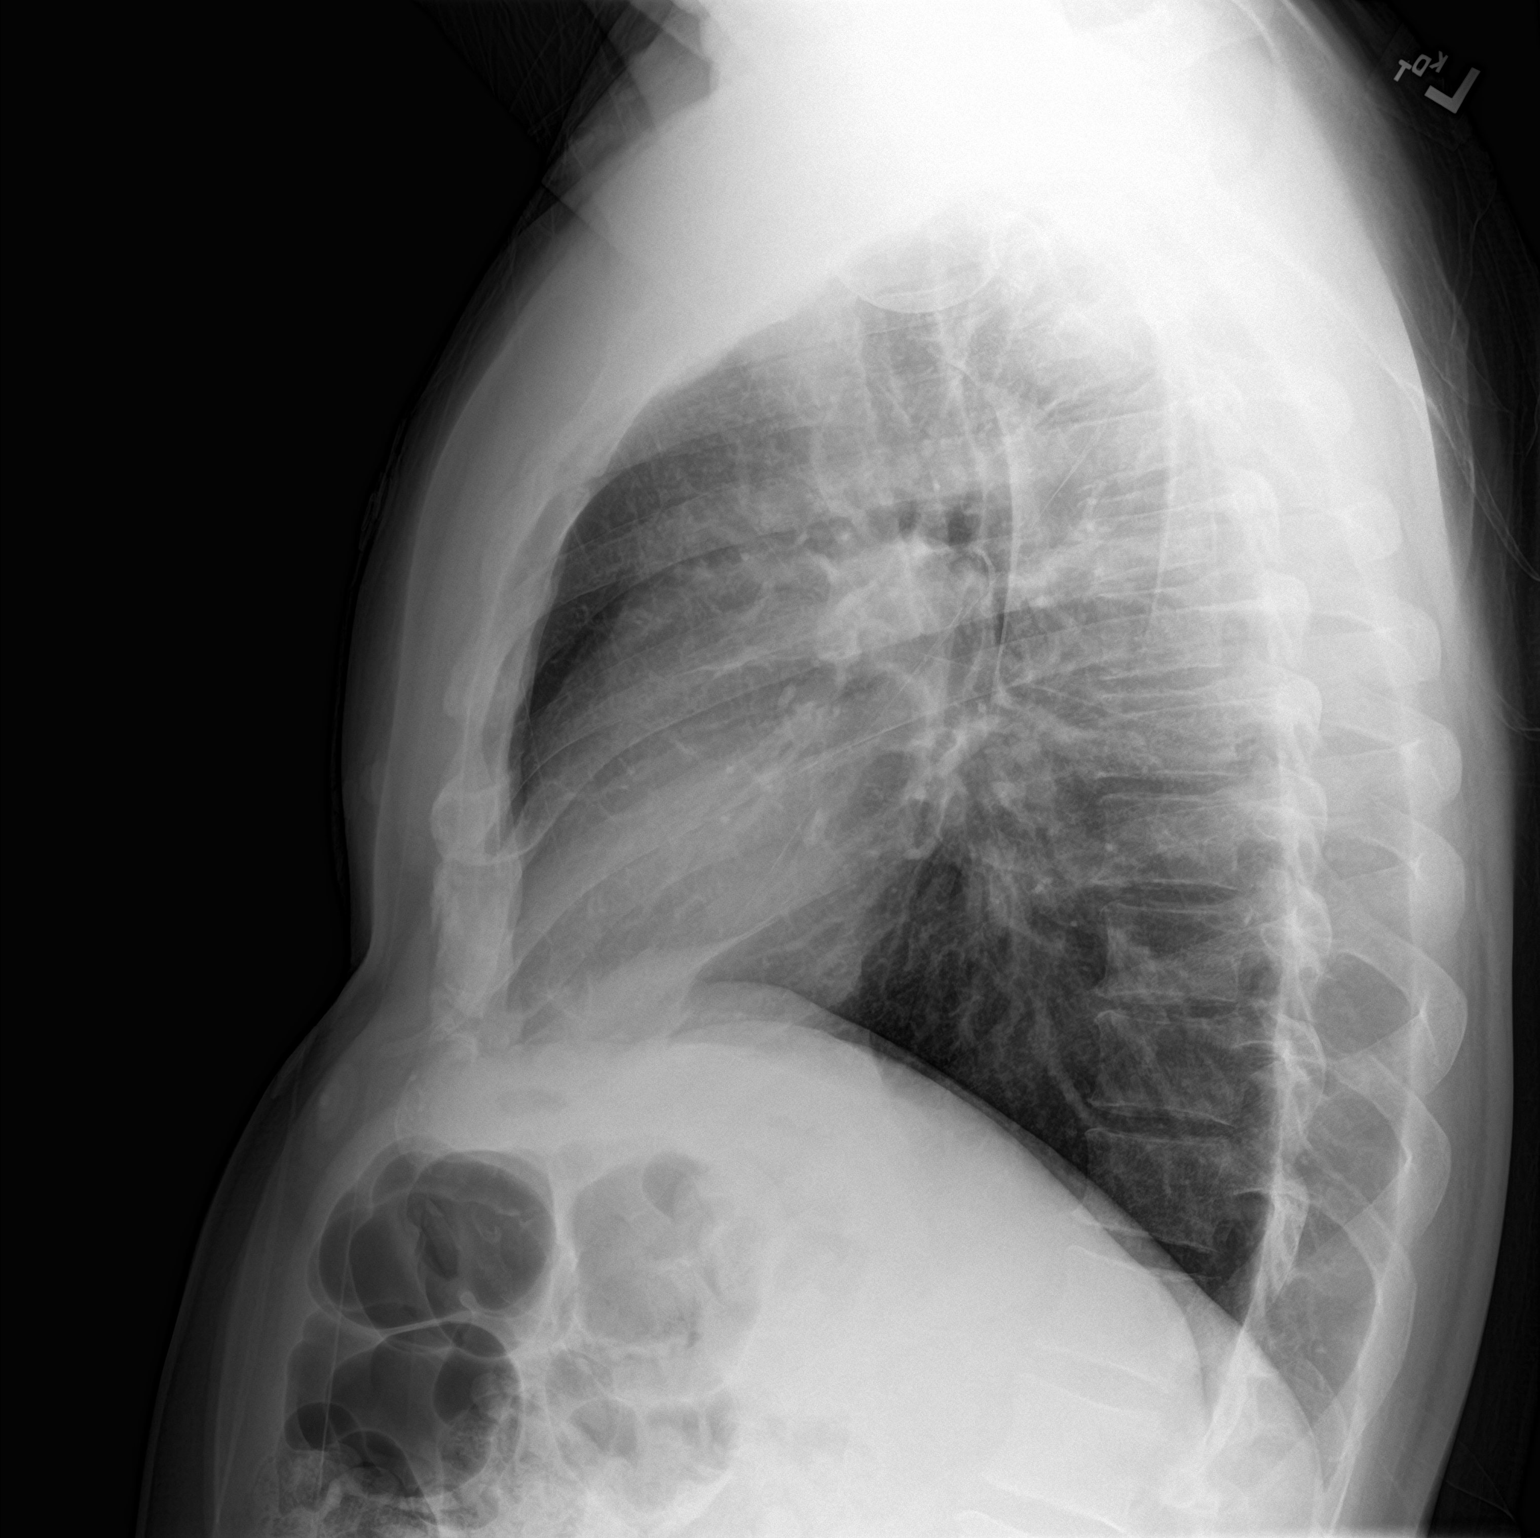

[2 of 2 positions shown; findings below may reference images not displayed]

FINDINGS: The heart size and mediastinal contours are within normal limits.
Both lungs are clear. The visualized skeletal structures are
unremarkable.
IMPRESSION: No active cardiopulmonary disease.

## 2019-01-22 ENCOUNTER — Ambulatory Visit: Payer: Managed Care, Other (non HMO) | Admitting: Cardiology

## 2020-06-30 ENCOUNTER — Encounter (HOSPITAL_BASED_OUTPATIENT_CLINIC_OR_DEPARTMENT_OTHER): Payer: Self-pay | Admitting: Emergency Medicine

## 2020-06-30 ENCOUNTER — Emergency Department (HOSPITAL_BASED_OUTPATIENT_CLINIC_OR_DEPARTMENT_OTHER): Payer: BC Managed Care – PPO

## 2020-06-30 ENCOUNTER — Emergency Department (HOSPITAL_BASED_OUTPATIENT_CLINIC_OR_DEPARTMENT_OTHER)
Admission: EM | Admit: 2020-06-30 | Discharge: 2020-06-30 | Disposition: A | Discharge status: Home / Self Care | Payer: BC Managed Care – PPO | Attending: Emergency Medicine | Admitting: Emergency Medicine

## 2020-06-30 ENCOUNTER — Other Ambulatory Visit: Payer: Self-pay

## 2020-06-30 DIAGNOSIS — R1013 Epigastric pain: Secondary | ICD-10-CM

## 2020-06-30 DIAGNOSIS — K7689 Other specified diseases of liver: Secondary | ICD-10-CM | POA: Insufficient documentation

## 2020-06-30 DIAGNOSIS — Z79899 Other long term (current) drug therapy: Secondary | ICD-10-CM | POA: Insufficient documentation

## 2020-06-30 DIAGNOSIS — Z87891 Personal history of nicotine dependence: Secondary | ICD-10-CM | POA: Diagnosis not present

## 2020-06-30 DIAGNOSIS — R1011 Right upper quadrant pain: Secondary | ICD-10-CM

## 2020-06-30 DIAGNOSIS — I10 Essential (primary) hypertension: Secondary | ICD-10-CM | POA: Insufficient documentation

## 2020-06-30 DIAGNOSIS — R1010 Upper abdominal pain, unspecified: Secondary | ICD-10-CM | POA: Diagnosis present

## 2020-06-30 DIAGNOSIS — Z7982 Long term (current) use of aspirin: Secondary | ICD-10-CM | POA: Insufficient documentation

## 2020-06-30 DIAGNOSIS — I2510 Atherosclerotic heart disease of native coronary artery without angina pectoris: Secondary | ICD-10-CM | POA: Insufficient documentation

## 2020-06-30 LAB — COMPREHENSIVE METABOLIC PANEL
ALT: 15 U/L (ref 0–44)
AST: 22 U/L (ref 15–41)
Albumin: 4.9 g/dL (ref 3.5–5.0)
Alkaline Phosphatase: 54 U/L (ref 38–126)
Anion gap: 8 (ref 5–15)
BUN: 11 mg/dL (ref 6–20)
CO2: 31 mmol/L (ref 22–32)
Calcium: 10 mg/dL (ref 8.9–10.3)
Chloride: 102 mmol/L (ref 98–111)
Creatinine, Ser: 0.86 mg/dL (ref 0.61–1.24)
GFR, Estimated: >60 mL/min (ref >60)
Glucose, Bld: 92 mg/dL (ref 70–99)
Potassium: 3.5 mmol/L (ref 3.5–5.1)
Sodium: 141 mmol/L (ref 135–145)
Total Bilirubin: 0.5 mg/dL (ref 0.3–1.2)
Total Protein: 7.1 g/dL (ref 6.5–8.1)

## 2020-06-30 LAB — CBC WITH DIFFERENTIAL/PLATELET
Abs Immature Granulocytes: 0.02 10*3/uL (ref 0.00–0.07)
Basophils Absolute: 0.1 10*3/uL (ref 0.0–0.1)
Basophils Relative: 1 %
Eosinophils Absolute: 0.3 10*3/uL (ref 0.0–0.5)
Eosinophils Relative: 3 %
HCT: 43.1 % (ref 39.0–52.0)
Hemoglobin: 14.9 g/dL (ref 13.0–17.0)
Immature Granulocytes: 0 %
Lymphocytes Relative: 20 %
Lymphs Abs: 1.9 10*3/uL (ref 0.7–4.0)
MCH: 28.8 pg (ref 26.0–34.0)
MCHC: 34.6 g/dL (ref 30.0–36.0)
MCV: 83.4 fL (ref 80.0–100.0)
Monocytes Absolute: 0.7 10*3/uL (ref 0.1–1.0)
Monocytes Relative: 7 %
Neutro Abs: 6.7 10*3/uL (ref 1.7–7.7)
Neutrophils Relative %: 69 %
Platelets: 189 10*3/uL (ref 150–400)
RBC: 5.17 MIL/uL (ref 4.22–5.81)
RDW: 13.7 % (ref 11.5–15.5)
WBC: 9.7 10*3/uL (ref 4.0–10.5)
nRBC: 0 % (ref 0.0–0.2)

## 2020-06-30 LAB — URINALYSIS, ROUTINE W REFLEX MICROSCOPIC
Bilirubin Urine: NEGATIVE
Glucose, UA: NEGATIVE mg/dL
Hgb urine dipstick: NEGATIVE
Ketones, ur: NEGATIVE mg/dL
Leukocytes,Ua: NEGATIVE
Nitrite: NEGATIVE
Protein, ur: NEGATIVE mg/dL
Specific Gravity, Urine: 1.005 (ref 1.005–1.030)
pH: 6 (ref 5.0–8.0)

## 2020-06-30 LAB — LIPASE, BLOOD: Lipase: 19 U/L (ref 11–51)

## 2020-06-30 MED ORDER — PANTOPRAZOLE SODIUM 40 MG IV SOLR
40.0000 mg | Freq: Once | INTRAVENOUS | Status: AC
  Administered 2020-06-30: 40 mg via INTRAVENOUS
  Filled 2020-06-30: qty 40

## 2020-06-30 MED ORDER — PANTOPRAZOLE SODIUM 20 MG PO TBEC: 20.0000 mg | DELAYED_RELEASE_TABLET | Freq: Every day | ORAL | 0 refills | Status: AC

## 2020-06-30 NOTE — ED Triage Notes (Signed)
Central abd pain  X 2 weeks ago , it got better and then came back  Normal BM this am , no N/V ,  denies dysuria

## 2020-06-30 NOTE — Discharge Instructions (Addendum)
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

## 2020-06-30 NOTE — ED Provider Notes (Addendum)
[facility] EMERGENCY DEPT Provider Note   CSN: [id number] Arrival date & time: 06/30/20  1751     History Chief Complaint  Patient presents with  . Abdominal Pain    Aaron Blackburn is a 60 y.o. male.  Patient is a 60 year old male with a history of prior coronary artery disease, hypertension, hyperlipidemia who presents with upper abdominal pain.  He said he had a about 2 weeks ago but it went away and now came back again earlier this morning.  He describes it as a sharp knifelike pain its in his upper stomach.  He says it is nonradiating.  No nausea or vomiting.  No change in bowels.  No urinary symptoms.  No known fevers.  He does not really feel like it is related to eating.  He says it comes in waves.  He has not take anything for the pain.        Past Medical History:  Diagnosis Date  . CAD (coronary artery disease)   . GERD (gastroesophageal reflux disease)   . Hyperlipidemia   . Hypertension   . Myocardial infarction ([facility])   . Obesity     Patient Active Problem List   Diagnosis Date Noted  . Paresthesia of left arm 04/07/2015  . CAD (coronary artery disease) of artery bypass graft 12/18/2011  . Essential hypertension, malignant 12/18/2011  . Hyperlipidemia 12/18/2011    Past Surgical History:  Procedure Laterality Date  . CARDIAC CATHETERIZATION    . TONSILLECTOMY         Family History  Problem Relation Age of Onset  . Coronary artery disease Mother     Social History   Tobacco Use  . Smoking status: Former Games developer  . Smokeless tobacco: Never Used  Substance Use Topics  . Alcohol use: Yes    Comment: Occasional  . Drug use: No    Home Medications Prior to Admission medications   Medication Sig Start Date End Date Taking? Authorizing Provider  pantoprazole (PROTONIX) 20 MG tablet Take 1 tablet (20 mg total) by mouth daily. 06/30/20  Yes Rolan Bucco, MD  aspirin 81 MG tablet Take 81 mg by mouth daily.    [provider]   carvedilol (COREG) 12.5 MG tablet Take 1 tablet (12.5 mg total) by mouth 2 (two) times daily with a meal. 04/30/16   Crenshaw, Madolyn Frieze, MD  CRESTOR 40 MG tablet TAKE 1 TABLET (40 MG TOTAL) BY MOUTH DAILY. 02/06/16   Lewayne Bunting, MD  escitalopram (LEXAPRO) 10 MG tablet Take 10 mg by mouth daily.    [provider]  Multiple Vitamin (MULTIVITAMIN WITH MINERALS) TABS tablet Take 1 tablet by mouth daily.    [provider]  nitroGLYCERIN (NITROSTAT) 0.4 MG SL tablet Place 0.4 mg under the tongue every 5 (five) minutes as needed.    [provider]  omega-3 acid ethyl esters (LOVAZA) 1 g capsule Take 1 g by mouth daily.    [provider]  ramipril (ALTACE) 10 MG capsule TAKE 1 CAPSULE (10 MG TOTAL) BY MOUTH 2 (TWO) TIMES DAILY. 06/17/16   Lewayne Bunting, MD    Allergies    Patient has no known allergies.  Review of Systems   Review of Systems  Constitutional: Negative for chills, diaphoresis, fatigue and fever.  HENT: Negative for congestion, rhinorrhea and sneezing.   Eyes: Negative.   Respiratory: Negative for cough, chest tightness and shortness of breath.   Cardiovascular: Negative for chest pain and leg swelling.  Gastrointestinal: Positive for abdominal pain. Negative for blood in stool, diarrhea, nausea and vomiting.  Genitourinary: Negative for difficulty urinating, flank pain, frequency and hematuria.  Musculoskeletal: Negative for arthralgias and back pain.  Skin: Negative for rash.  Neurological: Negative for dizziness, speech difficulty, weakness, numbness and headaches.    Physical Exam Updated Vital Signs BP 132/90   Pulse (!) 59   Temp 98.8 F (37.1 C) (Oral)   Resp 19   Ht 5\' 8"  (1.727 m)   Wt 81.2 kg   SpO2 99%   BMI 27.22 kg/m   Physical Exam Constitutional:      Appearance: He is well-developed.  HENT:     Head: Normocephalic and atraumatic.  Eyes:     Pupils: Pupils are equal, round, and reactive to light.   Cardiovascular:     Rate and Rhythm: Normal rate and regular rhythm.     Heart sounds: Normal heart sounds.  Pulmonary:     Effort: Pulmonary effort is normal. No respiratory distress.     Breath sounds: Normal breath sounds. No wheezing or rales.  Chest:     Chest wall: No tenderness.  Abdominal:     General: Bowel sounds are normal.     Palpations: Abdomen is soft.     Tenderness: There is abdominal tenderness in the right upper quadrant and epigastric area. There is no guarding or rebound.  Musculoskeletal:        General: Normal range of motion.     Cervical back: Normal range of motion and neck supple.  Lymphadenopathy:     Cervical: No cervical adenopathy.  Skin:    General: Skin is warm and dry.     Findings: No rash.  Neurological:     Mental Status: He is alert and oriented to person, place, and time.     ED Results / Procedures / Treatments   Labs (all labs ordered are listed, but only abnormal results are displayed) Labs Reviewed  COMPREHENSIVE METABOLIC PANEL  LIPASE, BLOOD  CBC WITH DIFFERENTIAL/PLATELET  URINALYSIS, ROUTINE W REFLEX MICROSCOPIC    EKG EKG Interpretation  Date/Time:  Friday June 30 2020 18:36:59 EDT Ventricular Rate:  60 PR Interval:  166 QRS Duration: 108 QT Interval:  424 QTC Calculation: 424 R Axis:   37 Text Interpretation: Sinus rhythm since last tracing no significant change Confirmed by [id number] [phone]) on 06/30/2020 7:28:57 PM   Radiology No results found.  Procedures Procedures   Medications Ordered in ED Medications  pantoprazole (PROTONIX) injection 40 mg (40 mg Intravenous Given 06/30/20 1858)    ED Course  I have reviewed the triage vital signs and the nursing notes.  Pertinent labs & imaging results that were available during my care of the patient were reviewed by me and considered in my medical decision making (see chart for details).    MDM Rules/Calculators/A&P                          Patient is  a 60 year old male who presents with upper abdominal pain.  He has tenderness in his epigastrium and right upper quadrant.  He has no vomiting.  No fevers.  His pain is well controlled.  His labs are nonconcerning.  There is no clinical and laboratory evidence of infection/obstruction.  Unfortunately we do not have an ultrasound tech available tonight.  Will order an outpatient ultrasound for tomorrow.  He was given strict return precautions.  He was started on  Protonix.  He was advised on gallbladder diet.  22:00 we were able to get an ultrasound tech here he was able to do the gallbladder ultrasound.  It was negative for gallbladder disease.  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  I did discuss this with the patient.  He will follow-up with his PCP. Final Clinical Impression(s) / ED Diagnoses Final diagnoses:  RUQ pain    Rx / DC Orders ED Discharge Orders         Ordered    pantoprazole (PROTONIX) 20 MG tablet  Daily        06/30/20 2006    US Abdomen Limited RUQ/Gall Bladder        06/30/20 2007           Rolan Bucco, MD 06/30/20 2014    Rolan Bucco, MD 06/30/20 2202

## 2022-04-26 IMAGING — US US ABDOMEN LIMITED RUQ/ASCITES
1 series · 14 of 25 positions shown · non-contrast
Comparison: None.

CLINICAL DATA: Epigastric pain x1 day.

EXAM:
ULTRASOUND ABDOMEN LIMITED RIGHT UPPER QUADRANT

[Series 1: us abdomen limited ruq (liver/gb) · 68 acquisitions, 14 frames shown]
[im 1/68]
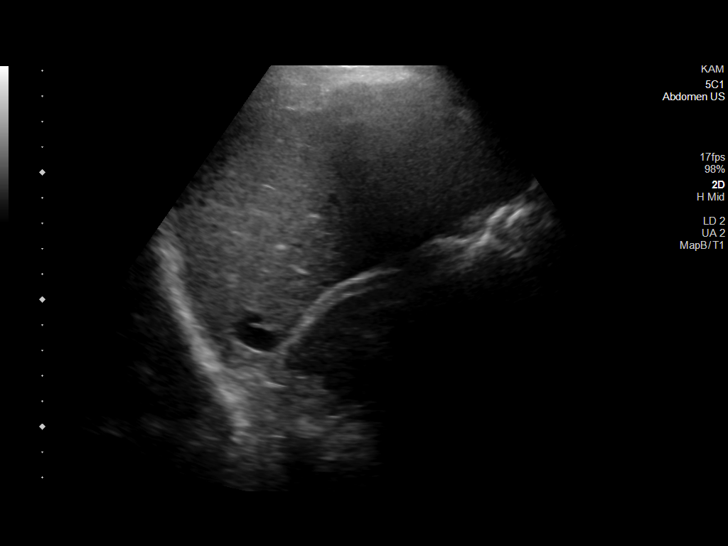
[im 6/68]
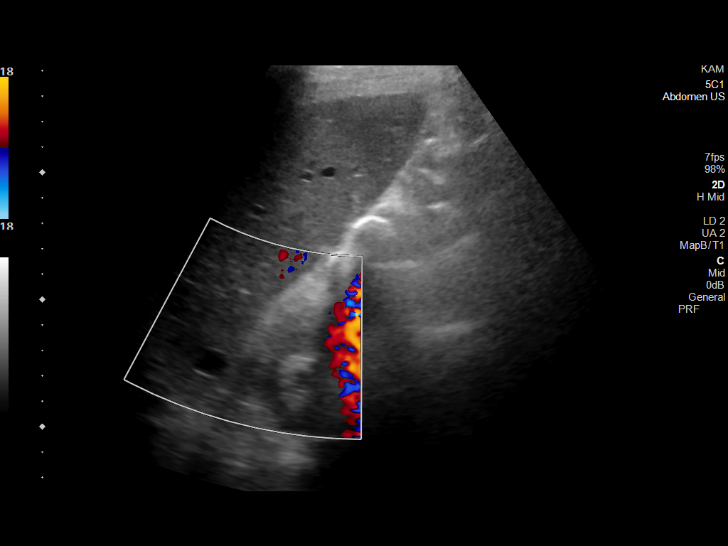
[im 12/68]
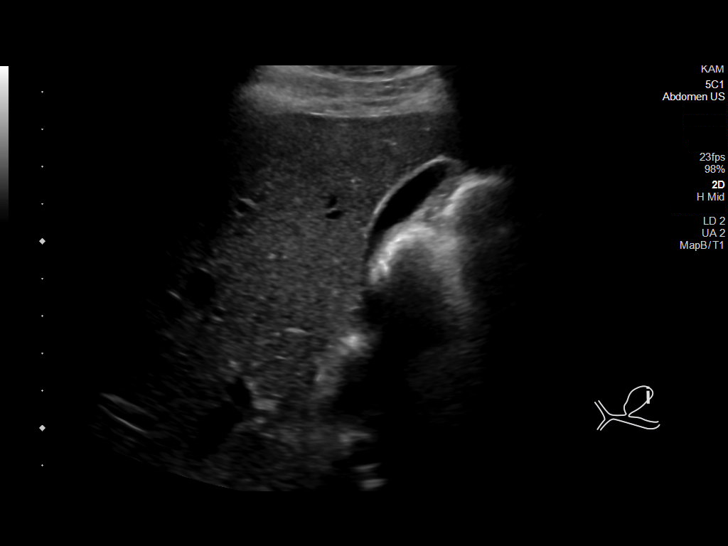
[im 17/68]
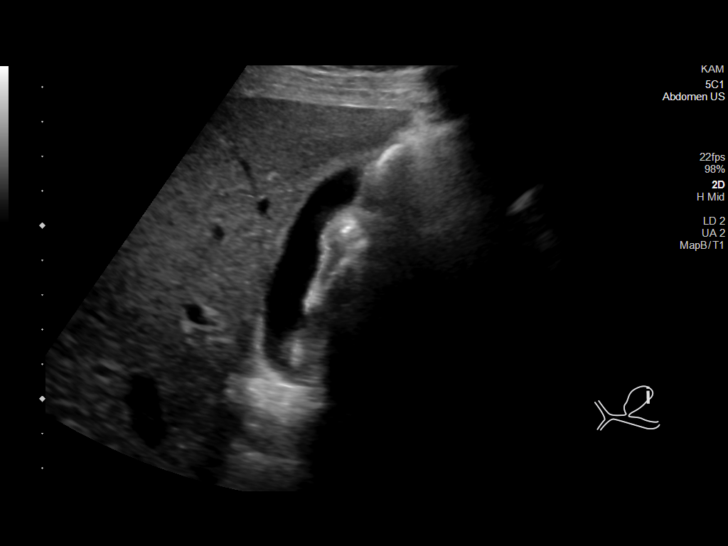
[im 23/68]
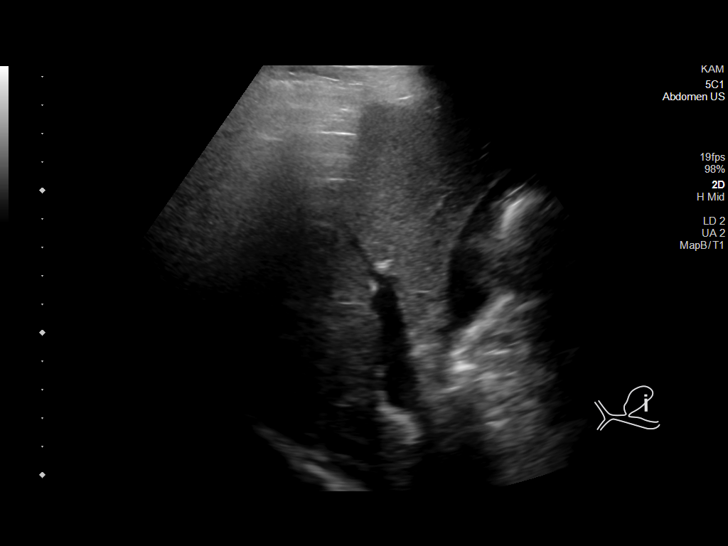
[im 26/68]
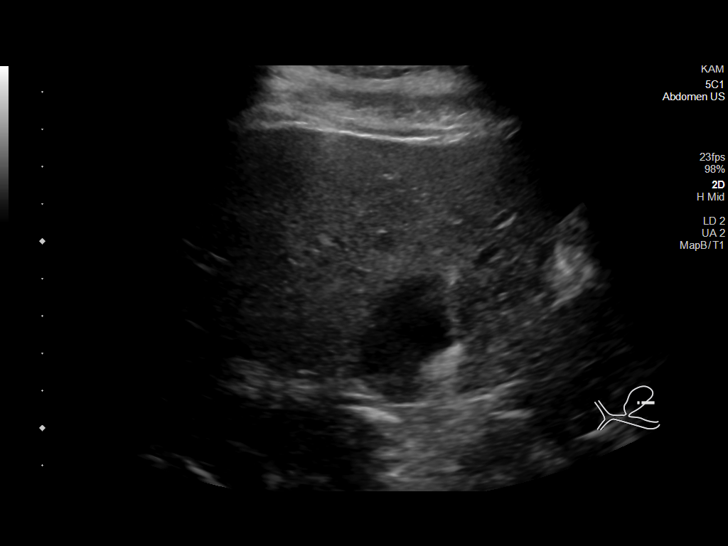
[im 31/68]
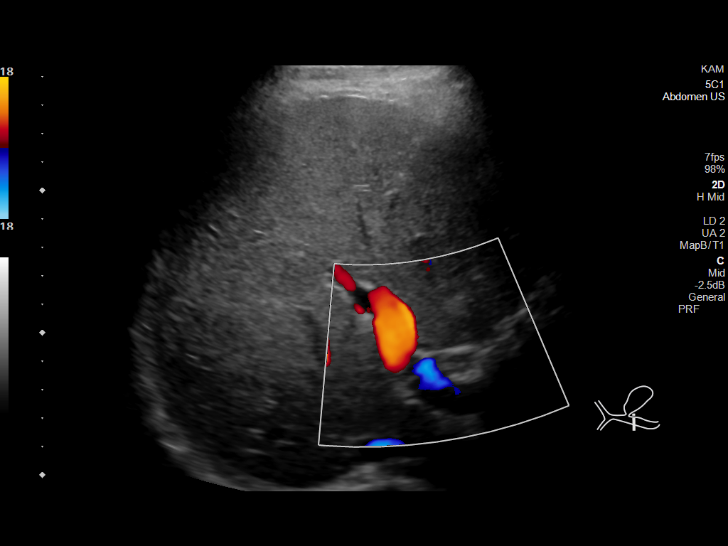
[im 37/68]
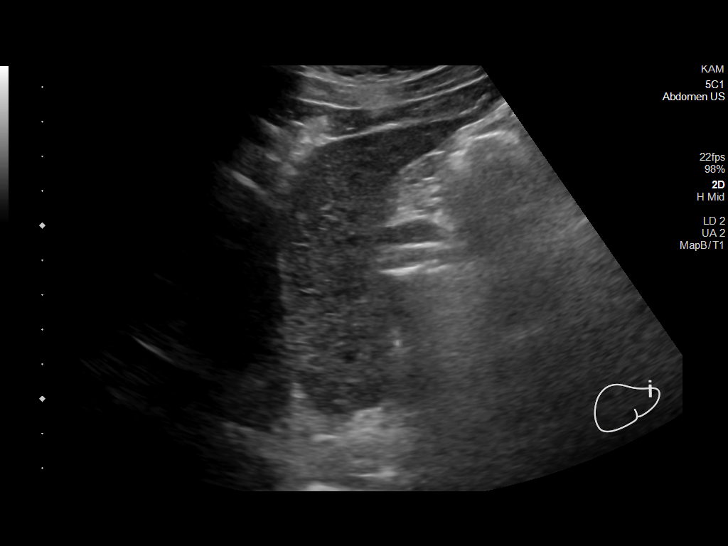
[im 42/68]
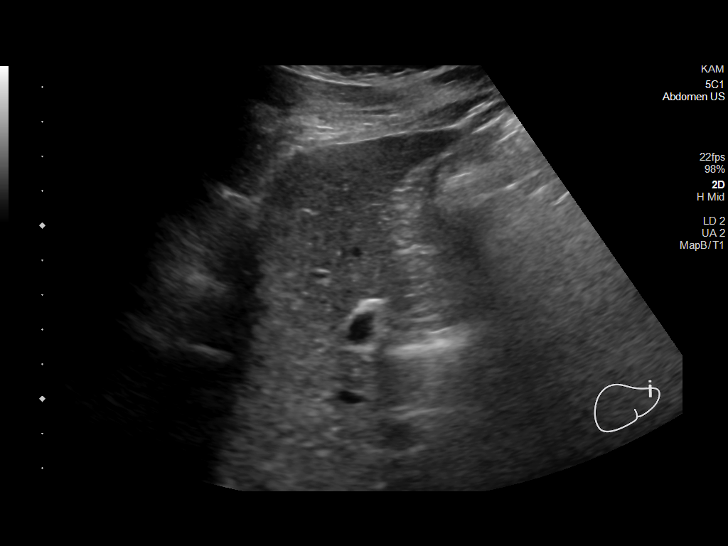
[im 45/68]
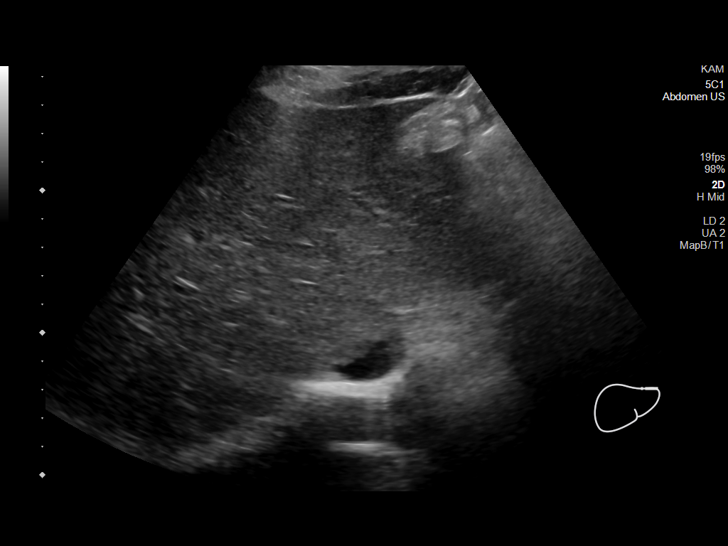
[im 51/68]
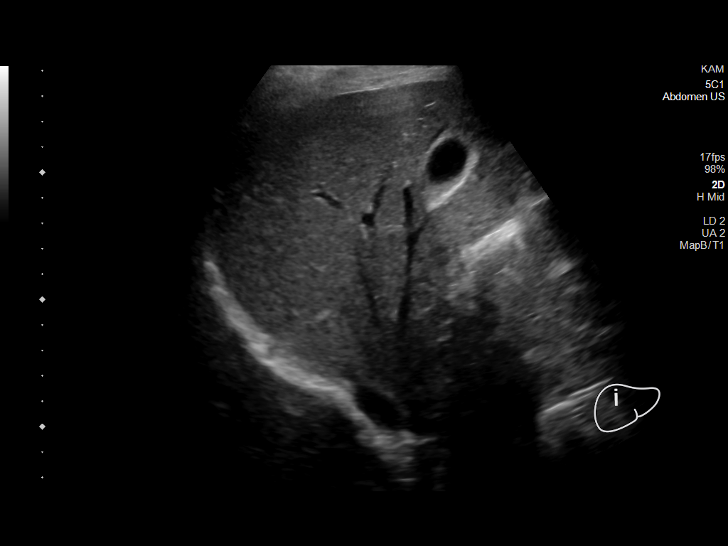
[im 56/68]
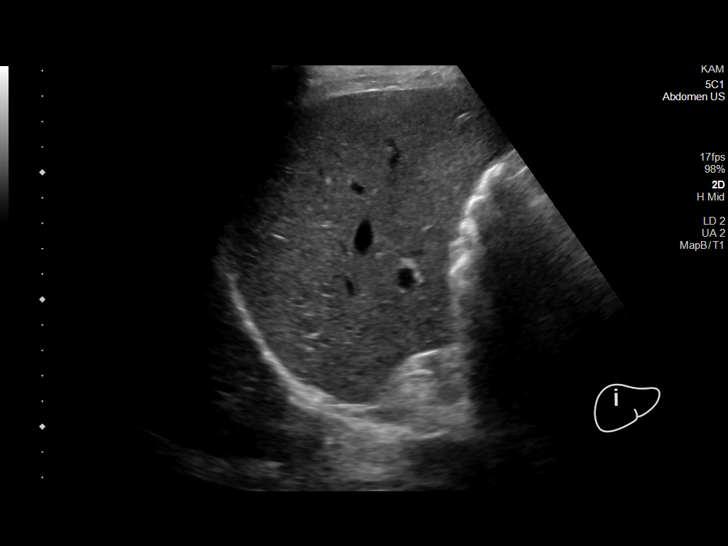
[im 62/68]
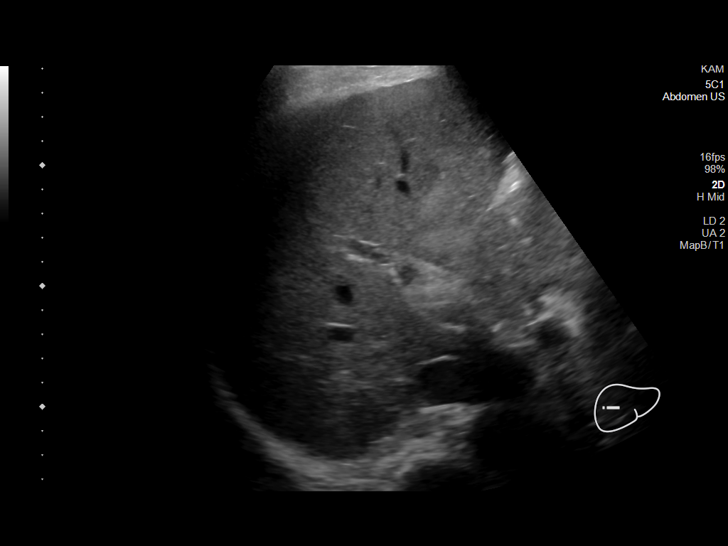
[im 68/68]
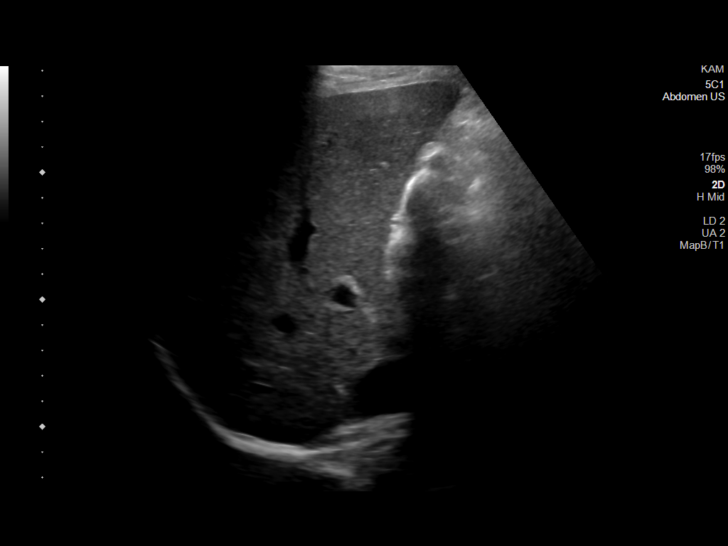

[14 of 25 positions shown; findings below may reference images not displayed]

FINDINGS: Gallbladder:

The gallbladder is limited in visualization secondary to overlying
bowel gas. No gallstones or wall thickening visualized (2.1 mm). No
sonographic Murphy sign noted by sonographer.

Common bile duct:

Diameter: 2.7 mm

Liver:

A 2.2 cm x 1.5 cm x 1.7 cm septated anechoic structure is seen
within the right lobe of the liver. No abnormal flow is noted within
this region on color Doppler evaluation. Diffusely increased
echogenicity of the liver parenchyma is seen. Portal vein is patent
on color Doppler imaging with normal direction of blood flow towards
the liver.

Other: None.
IMPRESSION: 1. Fatty liver.
2. Septated hepatic cyst, as described above. Further correlation
with nonemergent abdominal MRI is recommended.

## 2022-08-23 ENCOUNTER — Ambulatory Visit (HOSPITAL_COMMUNITY)
Admission: RE | Admit: 2022-08-23 | Discharge: 2022-08-23 | Disposition: A | Discharge status: Home / Self Care | Payer: Managed Care, Other (non HMO) | Source: Ambulatory Visit | Attending: Physician Assistant | Admitting: Physician Assistant

## 2022-08-23 ENCOUNTER — Other Ambulatory Visit (HOSPITAL_COMMUNITY): Payer: Self-pay | Admitting: Physician Assistant

## 2022-08-23 DIAGNOSIS — M79605 Pain in left leg: Secondary | ICD-10-CM | POA: Diagnosis present

## 2022-08-23 DIAGNOSIS — M7989 Other specified soft tissue disorders: Secondary | ICD-10-CM

## 2022-11-13 ENCOUNTER — Encounter: Payer: Self-pay | Admitting: Neurology

## 2022-11-13 ENCOUNTER — Ambulatory Visit (INDEPENDENT_AMBULATORY_CARE_PROVIDER_SITE_OTHER): Payer: 59 | Admitting: Neurology

## 2022-11-13 ENCOUNTER — Telehealth: Payer: Self-pay | Admitting: Neurology

## 2022-11-13 VITALS — BP 138/90 | HR 70 | Ht 68.0 in | Wt 179.0 lb

## 2022-11-13 DIAGNOSIS — R413 Other amnesia: Secondary | ICD-10-CM | POA: Insufficient documentation

## 2022-11-13 DIAGNOSIS — F419 Anxiety disorder, unspecified: Secondary | ICD-10-CM | POA: Insufficient documentation

## 2022-11-13 MED ORDER — ESCITALOPRAM OXALATE 20 MG PO TABS: 20.0000 mg | ORAL_TABLET | Freq: Every day | ORAL | 3 refills | Status: AC

## 2022-11-13 NOTE — Telephone Encounter (Signed)
sent to GI they obtain [employer] auth. Patient requested the [facility] location. [phone]

## 2022-11-13 NOTE — Progress Notes (Signed)
Chief Complaint  Patient presents with   New Patient (Initial Visit)           ASSESSMENT AND PLAN  Aaron Blackburn is a 62 y.o. male   Mild cognitive impairment Anxiety  [facility] examination 20/30, missed 5 out of 5 recalls  The pattern of his memory loss do raise concern of underlying central nervous system degenerative disorder, however this happened in the setting of extreme stress, worsening anxiety, we will increase his Lexapro to 20 mg daily first  Complete evaluation with MRI of the brain, laboratory evaluations,  DIAGNOSTIC DATA (LABS, IMAGING, TESTING) - I reviewed patient records, labs, notes, testing and imaging myself where available.   MEDICAL HISTORY:  Aaron Blackburn is a 62 year old male, accompanied by his wife and daughter at today's visit for evaluation of memory loss on November 13, 2022,  seen in request by his primary care physician Dr. Cyndia Bent, Kayleen Memos, MD   I reviewed and summarized the referring note. PMHX. GERD HLD CAD in 2008 HTN Anxiety.  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, this also caused extra stress of adapting to the new environment,  He already had underlying anxiety disorder, over the past many years taking Lexapro 10 mg every day, family noted that going through the stress, he has noticeable worsening memory loss, word finding difficulties, misplace things, but denies difficulty driving, or handling his job  He is now on day shift, going to work night shift 5 PM to 5 AM soon, overall sleep well, but has frequent awakening, able to go back to sleep, loud snoring, sometimes catching his breath, excessive daytime sleepiness, fatigue, like to take a nap  His father died of dementia at age 77  74 MoCA examination 20/30   PHYSICAL EXAM:   Vitals:   11/13/22 1343  BP: (!) 138/90  Pulse: 70  Weight: 179 lb (81.2 kg)  Height: 5\' 8"  (1.727 m)   Body mass index is 27.22  kg/m.  PHYSICAL EXAMNIATION:  Gen: NAD, conversant, well nourised, well groomed                     Cardiovascular: Regular rate rhythm, no peripheral edema, warm, nontender. Eyes: Conjunctivae clear without exudates or hemorrhage Neck: Supple, no carotid bruits. Pulmonary: Clear to auscultation bilaterally   NEUROLOGICAL EXAM:  MENTAL STATUS: Speech/cognition: Awake, alert, oriented to history taking and casual conversation    11/13/2022    1:45 PM  Montreal Cognitive Assessment   Visuospatial/ Executive (0/5) 4  Naming (0/3) 3  Attention: Read list of digits (0/2) 2  Attention: Read list of letters (0/1) 1  Attention: Serial 7 subtraction starting at 100 (0/3) 2  Language: Repeat phrase (0/2) 1  Language : Fluency (0/1) 0  Abstraction (0/2) 2  Delayed Recall (0/5) 0  Orientation (0/6) 5  Total 20  Adjusted Score (based on education) 20      CRANIAL NERVES: CN II: Visual fields are full to confrontation. Pupils are round equal and briskly reactive to light. CN III, IV, VI: extraocular movement are normal. No ptosis. CN V: Facial sensation is intact to light touch CN VII: Face is symmetric with normal eye closure  CN VIII: Hearing is normal to causal conversation. CN IX, X: Phonation is normal. CN XI: Head turning and shoulder shrug are intact  MOTOR: There is no pronator drift of out-stretched arms. Muscle bulk and  tone are normal. Muscle strength is normal.  REFLEXES: Reflexes are 2+ and symmetric at the biceps, triceps, knees, and ankles. Plantar responses are flexor.  SENSORY: Intact to light touch, pinprick and vibratory sensation are intact in fingers and toes.  COORDINATION: There is no trunk or limb dysmetria noted.  GAIT/STANCE: Posture is normal. Gait is steady with normal steps, base, arm swing, and turning. Heel and toe walking are normal. Tandem gait is normal.  Romberg is absent.  REVIEW OF SYSTEMS:  Full 14 system review of systems performed  and notable only for as above All other review of systems were negative.   ALLERGIES: No Known Allergies  HOME MEDICATIONS: Current Outpatient Medications  Medication Sig Dispense Refill   Apoaequorin (PREVAGEN) 10 MG CAPS Take 1 tablet by mouth daily.     ASHWAGANDHA PO Take 700 mg by mouth daily.     aspirin 81 MG tablet Take 81 mg by mouth daily.     carvedilol (COREG) 12.5 MG tablet Take 1 tablet (12.5 mg total) by mouth 2 (two) times daily with a meal. 180 tablet 0   CRESTOR 40 MG tablet TAKE 1 TABLET (40 MG TOTAL) BY MOUTH DAILY. 30 tablet 5   cyanocobalamin (VITAMIN B12) 500 MCG tablet Take 500 mcg by mouth daily.     escitalopram (LEXAPRO) 10 MG tablet Take 10 mg by mouth daily.     Inulin (FIBER CHOICE PO) Take 1 tablet by mouth daily.     Multiple Vitamin (MULTIVITAMIN WITH MINERALS) TABS tablet Take 1 tablet by mouth daily.     nitroGLYCERIN (NITROSTAT) 0.4 MG SL tablet Place 0.4 mg under the tongue every 5 (five) minutes as needed.     omega-3 acid ethyl esters (LOVAZA) 1 g capsule Take 1 g by mouth daily.     pantoprazole (PROTONIX) 20 MG tablet Take 1 tablet (20 mg total) by mouth daily. 30 tablet 0   No current facility-administered medications for this visit.    PAST MEDICAL HISTORY: Past Medical History:  Diagnosis Date   CAD (coronary artery disease)    GERD (gastroesophageal reflux disease)    Hyperlipidemia    Hypertension    Myocardial infarction (HCC)    Obesity     PAST SURGICAL HISTORY: Past Surgical History:  Procedure Laterality Date   CARDIAC CATHETERIZATION     TONSILLECTOMY      FAMILY HISTORY: Family History  Problem Relation Age of Onset   Coronary artery disease Mother     SOCIAL HISTORY: Social History   Socioeconomic History   Marital status: Married    Spouse name: [name]   Number of children: 2   Years of education: Not on file   Highest education level: 12th grade  Occupational History    Employer: [employer]   Tobacco Use   Smoking status: Former   Smokeless tobacco: Never  Vaping Use   Vaping status: Never Used  Substance and Sexual Activity   Alcohol use: Yes    Alcohol/week: 5.0 standard drinks of alcohol    Types: 5 Standard drinks or equivalent per week    Comment: Occasional   Drug use: No   Sexual activity: Yes    Birth control/protection: None  Other Topics Concern   Not on file  Social History Narrative   Not on file   Social Determinants of Health   Financial Resource Strain: Low Risk  (08/23/2022)   Received from [facility], [facility]   Overall Financial Resource Strain (CARDIA)  Difficulty of Paying Living Expenses: Not hard at all  Food Insecurity: No Food Insecurity (08/23/2022)   Received from [facility], [facility]   Hunger Vital Sign    Worried About Running Out of Food in the Last Year: Never true    Ran Out of Food in the Last Year: Never true  Transportation Needs: No Transportation Needs (08/23/2022)   Received from [facility], [facility]   [facility] - Transportation    Lack of Transportation (Medical): No    Lack of Transportation (Non-Medical): No  Physical Activity: Insufficiently Active (08/23/2022)   Received from [facility], [facility]   Exercise Vital Sign    Days of Exercise per Week: 2 days    Minutes of Exercise per Session: 30 min  Stress: No Stress Concern Present (08/23/2022)   Received from [facility], [facility] - Occupational Stress Questionnaire    Feeling of Stress : Not at all  Social Connections: Socially Integrated (08/23/2022)   [facility]   Social Network    How would you rate your social network (family, work, friends)?: Good participation with social networks  Intimate Partner Violence: Not At Risk (08/23/2022)   Received from [facility], [facility]   HITS    Over the last 12 months how often did your partner physically  hurt you?: 1    Over the last 12 months how often did your partner insult you or talk down to you?: 1    Over the last 12 months how often did your partner threaten you with physical harm?: 1    Over the last 12 months how often did your partner scream or curse at you?: 1      Levert Feinstein, M.D. Ph.D.  [facility] [address], [address] [place], [place] [address] Ph: [phone] Fax: [phone]  CC:  Pollyann Samples, MD [address] [place],  [place] [address]  Eartha Inch, MD

## 2022-11-14 ENCOUNTER — Ambulatory Visit: Payer: 59 | Admitting: Neurology

## 2022-11-16 LAB — CBC WITH DIFFERENTIAL/PLATELET
Basophils Absolute: 0.1 10*3/uL (ref 0.0–0.2)
Basos: 1 %
EOS (ABSOLUTE): 0.3 10*3/uL (ref 0.0–0.4)
Eos: 3 %
Hematocrit: 40.7 % (ref 37.5–51.0)
Hemoglobin: 14.3 g/dL (ref 13.0–17.7)
Immature Grans (Abs): 0.1 10*3/uL (ref 0.0–0.1)
Immature Granulocytes: 1 %
Lymphocytes Absolute: 1.9 10*3/uL (ref 0.7–3.1)
Lymphs: 21 %
MCH: 29.1 pg (ref 26.6–33.0)
MCHC: 35.1 g/dL (ref 31.5–35.7)
MCV: 83 fL (ref 79–97)
Monocytes Absolute: 0.7 10*3/uL (ref 0.1–0.9)
Monocytes: 7 %
Neutrophils Absolute: 6.3 10*3/uL (ref 1.4–7.0)
Neutrophils: 67 %
Platelets: 209 10*3/uL (ref 150–450)
RBC: 4.92 x10E6/uL (ref 4.14–5.80)
RDW: 13.7 % (ref 11.6–15.4)
WBC: 9.3 10*3/uL (ref 3.4–10.8)

## 2022-11-16 LAB — COMPREHENSIVE METABOLIC PANEL
ALT: 13 IU/L (ref 0–44)
AST: 17 IU/L (ref 0–40)
Albumin: 4.5 g/dL (ref 3.9–4.9)
Alkaline Phosphatase: 75 IU/L (ref 44–121)
BUN/Creatinine Ratio: 15 (ref 10–24)
BUN: 12 mg/dL (ref 8–27)
Bilirubin Total: 0.6 mg/dL (ref 0.0–1.2)
CO2: 23 mmol/L (ref 20–29)
Calcium: 9.7 mg/dL (ref 8.6–10.2)
Chloride: 103 mmol/L (ref 96–106)
Creatinine, Ser: 0.78 mg/dL (ref 0.76–1.27)
Globulin, Total: 2 g/dL (ref 1.5–4.5)
Glucose: 93 mg/dL (ref 70–99)
Potassium: 4.5 mmol/L (ref 3.5–5.2)
Sodium: 144 mmol/L (ref 134–144)
Total Protein: 6.5 g/dL (ref 6.0–8.5)
eGFR: 101 mL/min/{1.73_m2} (ref >59)

## 2022-11-16 LAB — LIPID PANEL
Chol/HDL Ratio: 3.6 ratio (ref 0.0–5.0)
Cholesterol, Total: 135 mg/dL (ref 100–199)
HDL: 38 mg/dL — ABNORMAL LOW (ref >39)
LDL Chol Calc (NIH): 82 mg/dL (ref 0–99)
Triglycerides: 72 mg/dL (ref 0–149)
VLDL Cholesterol Cal: 15 mg/dL (ref 5–40)

## 2022-11-16 LAB — VITAMIN B12

## 2022-11-16 LAB — ANA W/REFLEX IF POSITIVE: Anti Nuclear Antibody (ANA): NEGATIVE

## 2022-11-16 LAB — HGB A1C W/O EAG: Hgb A1c MFr Bld: 5.5 % (ref 4.8–5.6)

## 2022-11-16 LAB — TSH: TSH: 1.39 u[IU]/mL (ref 0.450–4.500)

## 2022-11-16 LAB — RPR: RPR Ser Ql: NONREACTIVE

## 2022-11-16 LAB — C-REACTIVE PROTEIN: CRP: <1 mg/L (ref 0–10)

## 2022-11-16 LAB — SEDIMENTATION RATE: Sed Rate: 2 mm/h (ref 0–30)

## 2022-11-20 ENCOUNTER — Encounter: Payer: Self-pay | Admitting: Neurology

## 2022-11-21 ENCOUNTER — Encounter: Payer: Self-pay | Admitting: Neurology

## 2022-11-28 ENCOUNTER — Other Ambulatory Visit: Payer: Managed Care, Other (non HMO)

## 2022-12-23 ENCOUNTER — Ambulatory Visit
Admission: RE | Admit: 2022-12-23 | Discharge: 2022-12-23 | Disposition: A | Discharge status: Home / Self Care | Payer: No Typology Code available for payment source | Source: Ambulatory Visit | Attending: Neurology | Admitting: Neurology

## 2022-12-23 DIAGNOSIS — R413 Other amnesia: Secondary | ICD-10-CM

## 2023-01-09 ENCOUNTER — Encounter: Payer: Self-pay | Admitting: Neurology

## 2023-01-09 ENCOUNTER — Ambulatory Visit: Payer: Self-pay | Admitting: Neurology

## 2023-01-09 VITALS — BP 130/84 | HR 61 | Ht 68.0 in | Wt 187.6 lb

## 2023-01-09 DIAGNOSIS — R413 Other amnesia: Secondary | ICD-10-CM

## 2023-01-09 MED ORDER — DONEPEZIL HCL 5 MG PO TABS: 5.0000 mg | ORAL_TABLET | Freq: Every day | ORAL | 0 refills | Status: DC

## 2023-01-09 NOTE — Progress Notes (Signed)
SLEEP MEDICINE CLINIC    Provider:  Melvyn Novas, MD  Primary Care Physician:  Eartha Inch, MD [address] [place] [address]     Referring Provider: Levert Feinstein, Md [address] [place],  [place] [address]          Chief Complaint according to patient   Patient presents with:     New MEMORY Patient (Initial Visit)           HISTORY OF PRESENT ILLNESS:    Aaron Blackburn Is seen on 01/09/23.  Aaron Blackburn is a 62 y.o. male patient who is seen upon referral on 01/09/2023 from Dr Terrace Arabia  for a Memory work -up, concerns of his daughter [name].  .  Chief concern according to patient :  " I wasn't worried about memory until my daughter and some people at work became worried."    Neurological  medical history: CAD and MI at age 23 , angioplasty. No concussions or contusions.  No seizures. No hospitalizations in 20 years.       Family medical /sleep history: father  was in long term care facility from 35 -52 yars of age, but was not memory impaired at the time he moved in. He fell and broke his hip, followed by a stroke.    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   He lives in a household with spouse and daughter and son ( 62) , 2 dogs.  The patient currently worked the early shifts until July 2024.  He is the youngest of 3 children, grew up [place], and his siblings are healthy.   Tobacco use: none now, quit a 38 years ago.  ETOH use :  beers, 2-3 on a weekend night.  Caffeine intake in form of Coffee( 3 a day) Soda( at dinner ) Tea ( /) or energy drinks Exercise in form of gym exercises. .   Hobbies : working on the house,       Sleep habits are as follows: The patient's dinner time is between 4-5 PM. The patient goes to bed at 8-9 PM he spends days with family. Wife has always handled the finances , taxes, bills. Not getting lost e when driving, uses navigation system.  No primary  care provider is established.        Review of Systems: Out of a complete 14 system review, the patient complains of only the following symptoms, and all other reviewed systems are negative.:     Social History   Socioeconomic History   Marital status: Married    Spouse name: [name]   Number of children: 2   Years of education: Not on file   Highest education level: 12th grade  Occupational History    Employer: [employer]  Tobacco Use   Smoking status: Former   Smokeless tobacco: Never  Vaping Use   Vaping status: Never Used  Substance and Sexual Activity   Alcohol use: Yes    Alcohol/week: 5.0 standard drinks of alcohol    Types: 5 Standard drinks or equivalent per week    Comment: Occasional   Drug use: No   Sexual activity: Yes    Birth control/protection: None  Other Topics Concern   Not on file  Social History Narrative   Not on file   Social Determinants of Health   Financial Resource Strain: Low Risk  (  08/23/2022)   Received from [facility], [facility]   Overall Financial Resource Strain (CARDIA)    Difficulty of Paying Living Expenses: Not hard at all  Food Insecurity: No Food Insecurity (08/23/2022)   Received from [facility], [facility]   Hunger Vital Sign    Worried About Running Out of Food in the Last Year: Never true    Ran Out of Food in the Last Year: Never true  Transportation Needs: No Transportation Needs (08/23/2022)   Received from [facility], [facility]   [facility] - Transportation    Lack of Transportation (Medical): No    Lack of Transportation (Non-Medical): No  Physical Activity: Insufficiently Active (08/23/2022)   Received from [facility], [facility]   Exercise Vital Sign    Days of Exercise per Week: 2 days    Minutes of Exercise per Session: 30 min  Stress: No Stress Concern Present (08/23/2022)   Received from [facility], [facility] of Occupational Health - Occupational Stress  Questionnaire    Feeling of Stress : Not at all  Social Connections: Socially Integrated (08/23/2022)   Received from [facility], [facility]   Social Network    How would you rate your social network (family, work, friends)?: Good participation with social networks    Family History  Problem Relation Age of Onset   Coronary artery disease Mother     Past Medical History:  Diagnosis Date   CAD (coronary artery disease)    GERD (gastroesophageal reflux disease)    Hyperlipidemia    Hypertension    Myocardial infarction (HCC)    Obesity     Past Surgical History:  Procedure Laterality Date   CARDIAC CATHETERIZATION     TONSILLECTOMY       Current Outpatient Medications on File Prior to Visit  Medication Sig Dispense Refill   Apoaequorin (PREVAGEN) 10 MG CAPS Take 1 tablet by mouth daily.     ASHWAGANDHA PO Take 700 mg by mouth daily.     aspirin 81 MG tablet Take 81 mg by mouth daily.     atorvastatin (LIPITOR) 80 MG tablet Take 80 mg by mouth daily.     carvedilol (COREG) 12.5 MG tablet Take 1 tablet (12.5 mg total) by mouth 2 (two) times daily with a meal. 180 tablet 0   CRESTOR 40 MG tablet TAKE 1 TABLET (40 MG TOTAL) BY MOUTH DAILY. 30 tablet 5   cyanocobalamin (VITAMIN B12) 500 MCG tablet Take 500 mcg by mouth daily.     escitalopram (LEXAPRO) 20 MG tablet Take 1 tablet (20 mg total) by mouth daily. 90 tablet 3   Inulin (FIBER CHOICE PO) Take 1 tablet by mouth daily.     lisinopril (ZESTRIL) 10 MG tablet Take 10 mg by mouth daily.     Multiple Vitamin (MULTIVITAMIN WITH MINERALS) TABS tablet Take 1 tablet by mouth daily.     nitroGLYCERIN (NITROSTAT) 0.4 MG SL tablet Place 0.4 mg under the tongue every 5 (five) minutes as needed.     omega-3 acid ethyl esters (LOVAZA) 1 g capsule Take 1 g by mouth daily.     pantoprazole (PROTONIX) 20 MG tablet Take 1 tablet (20 mg total) by mouth daily. 30 tablet 0   No current facility-administered medications on file prior to  visit.    No Known Allergies   DIAGNOSTIC DATA (LABS, IMAGING, TESTING) - I reviewed patient records, labs, notes, testing and imaging myself where available.  Lab  Results  Component Value Date   WBC 9.3 11/13/2022   HGB 14.3 11/13/2022   HCT 40.7 11/13/2022   MCV 83 11/13/2022   PLT 209 11/13/2022      Component Value Date/Time   NA 144 11/13/2022 1535   K 4.5 11/13/2022 1535   CL 103 11/13/2022 1535   CO2 23 11/13/2022 1535   GLUCOSE 93 11/13/2022 1535   GLUCOSE 92 06/30/2020 1848   BUN 12 11/13/2022 1535   CREATININE 0.78 11/13/2022 1535   CREATININE 0.86 07/10/2015 1442   CALCIUM 9.7 11/13/2022 1535   PROT 6.5 11/13/2022 1535   ALBUMIN 4.5 11/13/2022 1535   AST 17 11/13/2022 1535   ALT 13 11/13/2022 1535   ALKPHOS 75 11/13/2022 1535   BILITOT 0.6 11/13/2022 1535   GFRNONAA >60 06/30/2020 1848   GFRNONAA >89 12/18/2011 1643   GFRAA >60 04/07/2015 0420   GFRAA >89 12/18/2011 1643   Lab Results  Component Value Date   CHOL 135 11/13/2022   HDL 38 (L) 11/13/2022   LDLCALC 82 11/13/2022   TRIG 72 11/13/2022   CHOLHDL 3.6 11/13/2022   Lab Results  Component Value Date   HGBA1C 5.5 11/13/2022   Lab Results  Component Value Date   VITAMINB12 CANCELED 11/13/2022   Lab Results  Component Value Date   TSH 1.390 11/13/2022    PHYSICAL EXAM:  Today's Vitals   01/09/23 1004  BP: 130/84  Pulse: 61  Weight: 187 lb 9.6 oz (85.1 kg)  Height: 5\' 8"  (1.727 m)   Body mass index is 28.52 kg/m.   Wt Readings from Last 3 Encounters:  01/09/23 187 lb 9.6 oz (85.1 kg)  11/13/22 179 lb (81.2 kg)  06/30/20 179 lb (81.2 kg)     Ht Readings from Last 3 Encounters:  01/09/23 5\' 8"  (1.727 m)  11/13/22 5\' 8"  (1.727 m)  06/30/20 5\' 8"  (1.727 m)      General: The patient is awake, alert and appears not in acute distress. The patient is pleasant and well  groomed. Head: Normocephalic, atraumatic. Neck is supple. Mallampati 3,  neck circumference:16.5  inches .  Nasal airflow congested  - Retrognathia is not seen.  Dental status: biological  Cardiovascular:  Regular rate and cardiac rhythm by pulse,  without distended neck veins. Respiratory: Lungs are clear to auscultation.  Skin:  Without evidence of ankle edema, or rash. Trunk: The patient's posture is erect.   NEUROLOGIC EXAM: The patient is awake and alert, oriented to place and time.   Memory subjective described as intact. He does state he forgets words, naming objects has been difficult. Non fluent.     11/13/2022    1:45 PM  Montreal Cognitive Assessment   Visuospatial/ Executive (0/5) 4  Naming (0/3) 3  Attention: Read list of digits (0/2) 2  Attention: Read list of letters (0/1) 1  Attention: Serial 7 subtraction starting at 100 (0/3) 2  Language: Repeat phrase (0/2) 1  Language : Fluency (0/1) 0  Abstraction (0/2) 2  Delayed Recall (0/5) 0  Orientation (0/6) 5  Total 20  Adjusted Score (based on education) 20   mini-mental status exam: Done here, 01/09/2023         Attention span & concentration ability appears normal.  Speech is fluent,  without dysarthria, dysphonia or aphasia.  Mood and affect are appropriate.   Cranial nerves: no loss of smell or taste reported  Pupils are equal and briskly reactive to light. Funduscopic exam deferred.  Extraocular movements in vertical and horizontal planes were intact and without nystagmus. No Diplopia. Visual fields by finger perimetry are intact. Hearing was intact to soft voice and finger rubbing.    Facial sensation intact to fine touch.  Facial motor strength is symmetric and tongue and uvula move midline.  Neck ROM : rotation, tilt and flexion extension were normal for age and shoulder shrug was symmetrical.    Motor exam:  Symmetric bulk, tone and ROM.   Normal tone without cog wheeling, symmetric grip strength .   Sensory:  Fine touch, vibration were tnormal.  Proprioception tested in the upper extremities was  normal.   Coordination: Rapid alternating movements in the fingers/hands were of normal speed.  The Finger-to-nose maneuver was intact without evidence of ataxia, dysmetria or tremor.   Gait and station: Patient could rise unassisted from a seated position, walked without assistive device.  Stance is of normal width/ base and the patient turned with 3 steps.  Toe and heel walk were deferred.  Deep tendon reflexes: in the  upper and lower extremities are symmetric , prompt and intact. No clonus.      ASSESSMENT AND PLAN 62 y.o. year old male  here with:    1) Memory loss,  severe short term memory loss, decreased attention.   2) Educational background is HS graduate , but had difficulties in math, did not enjoy school. May have had learning challenges, he barely graduated ( he stated this ). Marland Kitchen   3) MRI and Labs reviewed. Normal MRI.   I will order an ATN test, and start  for now on aricept 5 mg.     I plan to follow up either personally or through our NP within 6 months.   I would like to thank Eartha Inch, MD and Levert Feinstein, Md [address] [place],  [place] [address] for allowing me to meet with and to take care of this pleasant patient.   After spending a total time of  30  minutes face to face and additional time for physical and neurologic examination, review of laboratory studies,  personal review of imaging studies, reports and results of other testing and review of referral information / records as far as provided in visit,   Electronically signed by: Melvyn Novas, MD 01/09/2023 10:24 AM  [facility] Board certified by The [employer] of Sleep Medicine and Diplomate of the [employer] of Sleep Medicine. Board certified In Neurology through the ABPN, Fellow of the [employer] of Neu[employer].

## 2023-01-09 NOTE — Patient Instructions (Signed)
Donepezil Tablets What is this medication? DONEPEZIL (doe NEP e zil) treats memory loss and confusion (dementia) in people who have Alzheimer disease. It works by improving attention, memory, and the ability to engage in daily activities. It is not a cure for dementia or Alzheimer disease. This medicine may be used for other purposes; ask your health care provider or pharmacist if you have questions. COMMON BRAND NAME(S): Aricept What should I tell my care team before I take this medication? They need to know if you have any of these conditions: Asthma or other lung disease Difficulty passing urine Head injury Heart disease History of irregular heartbeat Liver disease Seizures (convulsions) Stomach or intestinal disease, ulcers or stomach bleeding An unusual or allergic reaction to donepezil, other medications, foods, dyes, or preservatives Pregnant or trying to get pregnant Breast-feeding How should I use this medication? Take this medication by mouth with a glass of water. Follow the directions on the prescription label. You may take this medication with or without food. Take this medication at regular intervals. This medication is usually taken before bedtime. Do not take it more often than directed. Continue to take your medication even if you feel better. Do not stop taking except on your care team's advice. If you are taking the 23 mg donepezil tablet, swallow it whole; do not cut, crush, or chew it. Talk to your care team about the use of this medication in children. Special care may be needed. Overdosage: If you think you have taken too much of this medicine contact a poison control center or emergency room at once. NOTE: This medicine is only for you. Do not share this medicine with others. What if I miss a dose? If you miss a dose, take it as soon as you can. If it is almost time for your next dose, take only that dose, do not take double or extra doses. What may interact with  this medication? Do not take this medication with any of the following: Certain medications for fungal infections like itraconazole, fluconazole, posaconazole, and voriconazole Cisapride Dextromethorphan; quinidine Dronedarone Pimozide Quinidine Thioridazine This medication may also interact with the following: Antihistamines for allergy, cough and cold Atropine Bethanechol Carbamazepine Certain medications for bladder problems like oxybutynin, tolterodine Certain medications for Parkinson's disease like benztropine, trihexyphenidyl Certain medications for stomach problems like dicyclomine, hyoscyamine Certain medications for travel sickness like scopolamine Dexamethasone Dofetilide Ipratropium NSAIDs, medications for pain and inflammation, like ibuprofen or naproxen Other medications for Alzheimer's disease Other medications that prolong the QT interval (cause an abnormal heart rhythm) Phenobarbital Phenytoin Rifampin, rifabutin or rifapentine Ziprasidone This list may not describe all possible interactions. Give your health care provider a list of all the medicines, herbs, non-prescription drugs, or dietary supplements you use. Also tell them if you smoke, drink alcohol, or use illegal drugs. Some items may interact with your medicine. What should I watch for while using this medication? Visit your care team for regular checks on your progress. Check with your care team if your symptoms do not get better or if they get worse. You may get drowsy or dizzy. Do not drive, use machinery, or do anything that needs mental alertness until you know how this medication affects you. What side effects may I notice from receiving this medication? Side effects that you should report to your care team as soon as possible: Allergic reactions--skin rash, itching, hives, swelling of the face, lips, tongue, or throat Peptic ulcer--burning stomach pain, loss of appetite, bloating, burping,  heartburn, nausea, vomiting Seizures Slow heartbeat--dizziness, feeling faint or lightheaded, confusion, trouble breathing, unusual weakness or fatigue Stomach bleeding--bloody or black, tar-like stools, vomiting blood or brown material that looks like coffee grounds Trouble passing urine Side effects that usually do not require medical attention (report these to your care team if they continue or are bothersome): Diarrhea Fatigue Loss of appetite Muscle pain or cramps Nausea Trouble sleeping This list may not describe all possible side effects. Call your doctor for medical advice about side effects. You may report side effects to FDA at 1-800-FDA-1088. Where should I keep my medication? Keep out of reach of children. Store at room temperature between 15 and 30 degrees C (59 and 86 degrees F). Throw away any unused medication after the expiration date. NOTE: This sheet is a summary. It may not cover all possible information. If you have questions about this medicine, talk to your doctor, pharmacist, or health care provider.  2024 Elsevier/Gold Standard (2020-10-25 00:00:00)

## 2023-02-05 ENCOUNTER — Other Ambulatory Visit: Payer: Self-pay | Admitting: Neurology

## 2023-02-10 ENCOUNTER — Other Ambulatory Visit: Payer: Self-pay | Admitting: Neurology

## 2023-02-17 ENCOUNTER — Encounter: Payer: Self-pay | Admitting: Neurology

## 2023-02-26 MED ORDER — DONEPEZIL HCL 10 MG PO TABS: 10.0000 mg | ORAL_TABLET | Freq: Every day | ORAL | 3 refills | Status: DC

## 2023-03-10 ENCOUNTER — Other Ambulatory Visit: Payer: Managed Care, Other (non HMO)

## 2023-03-19 ENCOUNTER — Encounter (HOSPITAL_COMMUNITY): Payer: Self-pay

## 2023-03-19 ENCOUNTER — Inpatient Hospital Stay (HOSPITAL_COMMUNITY)
Admission: EM | Disposition: A | Discharge status: Home / Self Care | Payer: Self-pay | Source: Home / Self Care | Attending: Cardiology

## 2023-03-19 ENCOUNTER — Inpatient Hospital Stay (HOSPITAL_COMMUNITY)
Admission: EM | Admit: 2023-03-19 | Discharge: 2023-03-22 | DRG: 323 | Disposition: A | Discharge status: Home / Self Care | Payer: Self-pay | Attending: Cardiology | Admitting: Cardiology

## 2023-03-19 DIAGNOSIS — Y712 Prosthetic and other implants, materials and accessory cardiovascular devices associated with adverse incidents: Secondary | ICD-10-CM | POA: Diagnosis present

## 2023-03-19 DIAGNOSIS — E876 Hypokalemia: Secondary | ICD-10-CM | POA: Diagnosis not present

## 2023-03-19 DIAGNOSIS — Z6827 Body mass index (BMI) 27.0-27.9, adult: Secondary | ICD-10-CM

## 2023-03-19 DIAGNOSIS — Z8249 Family history of ischemic heart disease and other diseases of the circulatory system: Secondary | ICD-10-CM

## 2023-03-19 DIAGNOSIS — I213 ST elevation (STEMI) myocardial infarction of unspecified site: Principal | ICD-10-CM

## 2023-03-19 DIAGNOSIS — I10 Essential (primary) hypertension: Secondary | ICD-10-CM | POA: Diagnosis present

## 2023-03-19 DIAGNOSIS — Z7982 Long term (current) use of aspirin: Secondary | ICD-10-CM

## 2023-03-19 DIAGNOSIS — Z7902 Long term (current) use of antithrombotics/antiplatelets: Secondary | ICD-10-CM

## 2023-03-19 DIAGNOSIS — R001 Bradycardia, unspecified: Secondary | ICD-10-CM | POA: Diagnosis not present

## 2023-03-19 DIAGNOSIS — I2119 ST elevation (STEMI) myocardial infarction involving other coronary artery of inferior wall: Secondary | ICD-10-CM | POA: Insufficient documentation

## 2023-03-19 DIAGNOSIS — E669 Obesity, unspecified: Secondary | ICD-10-CM | POA: Diagnosis present

## 2023-03-19 DIAGNOSIS — I2121 ST elevation (STEMI) myocardial infarction involving left circumflex coronary artery: Secondary | ICD-10-CM | POA: Diagnosis present

## 2023-03-19 DIAGNOSIS — E785 Hyperlipidemia, unspecified: Secondary | ICD-10-CM | POA: Diagnosis present

## 2023-03-19 DIAGNOSIS — K219 Gastro-esophageal reflux disease without esophagitis: Secondary | ICD-10-CM | POA: Diagnosis present

## 2023-03-19 DIAGNOSIS — T82855A Stenosis of coronary artery stent, initial encounter: Principal | ICD-10-CM | POA: Diagnosis present

## 2023-03-19 DIAGNOSIS — I2510 Atherosclerotic heart disease of native coronary artery without angina pectoris: Secondary | ICD-10-CM | POA: Diagnosis present

## 2023-03-19 DIAGNOSIS — I2111 ST elevation (STEMI) myocardial infarction involving right coronary artery: Secondary | ICD-10-CM | POA: Diagnosis present

## 2023-03-19 DIAGNOSIS — Z87891 Personal history of nicotine dependence: Secondary | ICD-10-CM

## 2023-03-19 DIAGNOSIS — Z79899 Other long term (current) drug therapy: Secondary | ICD-10-CM

## 2023-03-19 DIAGNOSIS — Z9861 Coronary angioplasty status: Secondary | ICD-10-CM

## 2023-03-19 DIAGNOSIS — I959 Hypotension, unspecified: Secondary | ICD-10-CM | POA: Diagnosis not present

## 2023-03-19 DIAGNOSIS — I252 Old myocardial infarction: Secondary | ICD-10-CM

## 2023-03-19 HISTORY — PX: CORONARY/GRAFT ACUTE MI REVASCULARIZATION: CATH118305

## 2023-03-19 HISTORY — PX: LEFT HEART CATH AND CORONARY ANGIOGRAPHY: CATH118249

## 2023-03-19 LAB — POCT I-STAT, CHEM 8
BUN: 10 mg/dL (ref 8–23)
Calcium, Ion: 1.24 mmol/L (ref 1.15–1.40)
Chloride: 100 mmol/L (ref 98–111)
Creatinine, Ser: 0.7 mg/dL (ref 0.61–1.24)
Glucose, Bld: 125 mg/dL — ABNORMAL HIGH (ref 70–99)
HCT: 35 % — ABNORMAL LOW (ref 39.0–52.0)
Hemoglobin: 11.9 g/dL — ABNORMAL LOW (ref 13.0–17.0)
Potassium: 3.6 mmol/L (ref 3.5–5.1)
Sodium: 138 mmol/L (ref 135–145)
TCO2: 24 mmol/L (ref 22–32)

## 2023-03-19 LAB — CBC WITH DIFFERENTIAL/PLATELET
Abs Immature Granulocytes: 0.04 10*3/uL (ref 0.00–0.07)
Basophils Absolute: 0.1 10*3/uL (ref 0.0–0.1)
Basophils Relative: 1 %
Eosinophils Absolute: 0.3 10*3/uL (ref 0.0–0.5)
Eosinophils Relative: 4 %
HCT: 37.3 % — ABNORMAL LOW (ref 39.0–52.0)
Hemoglobin: 12.9 g/dL — ABNORMAL LOW (ref 13.0–17.0)
Immature Granulocytes: 0 %
Lymphocytes Relative: 16 %
Lymphs Abs: 1.6 10*3/uL (ref 0.7–4.0)
MCH: 28.3 pg (ref 26.0–34.0)
MCHC: 34.6 g/dL (ref 30.0–36.0)
MCV: 81.8 fL (ref 80.0–100.0)
Monocytes Absolute: 0.6 10*3/uL (ref 0.1–1.0)
Monocytes Relative: 7 %
Neutro Abs: 7.1 10*3/uL (ref 1.7–7.7)
Neutrophils Relative %: 72 %
Platelets: 188 10*3/uL (ref 150–400)
RBC: 4.56 MIL/uL (ref 4.22–5.81)
RDW: 13.3 % (ref 11.5–15.5)
WBC: 9.8 10*3/uL (ref 4.0–10.5)
nRBC: 0 % (ref 0.0–0.2)

## 2023-03-19 LAB — COMPREHENSIVE METABOLIC PANEL
ALT: 14 U/L (ref 0–44)
AST: 23 U/L (ref 15–41)
Albumin: 3.3 g/dL — ABNORMAL LOW (ref 3.5–5.0)
Alkaline Phosphatase: 72 U/L (ref 38–126)
Anion gap: 8 (ref 5–15)
BUN: 10 mg/dL (ref 8–23)
CO2: 23 mmol/L (ref 22–32)
Calcium: 8.1 mg/dL — ABNORMAL LOW (ref 8.9–10.3)
Chloride: 104 mmol/L (ref 98–111)
Creatinine, Ser: 0.74 mg/dL (ref 0.61–1.24)
GFR, Estimated: >60 mL/min (ref >60)
Glucose, Bld: 133 mg/dL — ABNORMAL HIGH (ref 70–99)
Potassium: 3.5 mmol/L (ref 3.5–5.1)
Sodium: 135 mmol/L (ref 135–145)
Total Bilirubin: 0.5 mg/dL (ref <1.2)
Total Protein: 5.1 g/dL — ABNORMAL LOW (ref 6.5–8.1)

## 2023-03-19 LAB — CBC
HCT: 41.7 % (ref 39.0–52.0)
Hemoglobin: 14.3 g/dL (ref 13.0–17.0)
MCH: 28.5 pg (ref 26.0–34.0)
MCHC: 34.3 g/dL (ref 30.0–36.0)
MCV: 83.1 fL (ref 80.0–100.0)
Platelets: 176 10*3/uL (ref 150–400)
RBC: 5.02 MIL/uL (ref 4.22–5.81)
RDW: 13.3 % (ref 11.5–15.5)
WBC: 10.5 10*3/uL (ref 4.0–10.5)
nRBC: 0 % (ref 0.0–0.2)

## 2023-03-19 LAB — CREATININE, SERUM
Creatinine, Ser: 0.75 mg/dL (ref 0.61–1.24)
GFR, Estimated: >60 mL/min (ref >60)

## 2023-03-19 LAB — MRSA NEXT GEN BY PCR, NASAL: MRSA by PCR Next Gen: NOT DETECTED

## 2023-03-19 LAB — HIV ANTIBODY (ROUTINE TESTING W REFLEX): HIV Screen 4th Generation wRfx: NONREACTIVE

## 2023-03-19 LAB — CG4 I-STAT (LACTIC ACID): Lactic Acid, Venous: 0.4 mmol/L — ABNORMAL LOW (ref 0.5–1.9)

## 2023-03-19 LAB — LACTIC ACID, PLASMA: Lactic Acid, Venous: 1.9 mmol/L (ref 0.5–1.9)

## 2023-03-19 SURGERY — CORONARY/GRAFT ACUTE MI REVASCULARIZATION
Anesthesia: LOCAL

## 2023-03-19 MED ORDER — HEPARIN (PORCINE) IN NACL 1000-0.9 UT/500ML-% IV SOLN
INTRAVENOUS | Status: DC | PRN
  Administered 2023-03-19 (×2): 500 mL

## 2023-03-19 MED ORDER — SODIUM CHLORIDE 0.9 % IV SOLN
INTRAVENOUS | Status: AC | PRN
  Administered 2023-03-19: 10 mL/h via INTRAVENOUS

## 2023-03-19 MED ORDER — SODIUM CHLORIDE 0.9% FLUSH
3.0000 mL | Freq: Two times a day (BID) | INTRAVENOUS | Status: DC
  Administered 2023-03-19 – 2023-03-21 (×4): 3 mL via INTRAVENOUS

## 2023-03-19 MED ORDER — METOPROLOL TARTRATE 25 MG PO TABS
25.0000 mg | ORAL_TABLET | Freq: Two times a day (BID) | ORAL | Status: DC
  Administered 2023-03-20 – 2023-03-21 (×3): 25 mg via ORAL
  Filled 2023-03-19 (×6): qty 1

## 2023-03-19 MED ORDER — ENOXAPARIN SODIUM 40 MG/0.4ML IJ SOSY: 40.0000 mg | PREFILLED_SYRINGE | INTRAMUSCULAR | Status: DC

## 2023-03-19 MED ORDER — LIDOCAINE HCL (PF) 1 % IJ SOLN
INTRAMUSCULAR | Status: AC
  Filled 2023-03-19: qty 30

## 2023-03-19 MED ORDER — TICAGRELOR 90 MG PO TABS
90.0000 mg | ORAL_TABLET | Freq: Two times a day (BID) | ORAL | Status: DC
  Administered 2023-03-20 – 2023-03-22 (×5): 90 mg via ORAL
  Filled 2023-03-19 (×5): qty 1

## 2023-03-19 MED ORDER — SODIUM CHLORIDE 0.9 % IV SOLN
250.0000 mL | INTRAVENOUS | Status: AC | PRN
Start: 2023-03-19 — End: 2023-03-20

## 2023-03-19 MED ORDER — ROSUVASTATIN CALCIUM 20 MG PO TABS: 40.0000 mg | ORAL_TABLET | Freq: Every day | ORAL | Status: DC

## 2023-03-19 MED ORDER — HEPARIN SODIUM (PORCINE) 1000 UNIT/ML IJ SOLN
INTRAMUSCULAR | Status: DC | PRN
  Administered 2023-03-19 (×2): 4000 [IU] via INTRAVENOUS

## 2023-03-19 MED ORDER — VERAPAMIL HCL 2.5 MG/ML IV SOLN
INTRAVENOUS | Status: AC
  Filled 2023-03-19: qty 4

## 2023-03-19 MED ORDER — SODIUM CHLORIDE 0.9% FLUSH: 3.0000 mL | INTRAVENOUS | Status: DC | PRN

## 2023-03-19 MED ORDER — ACETAMINOPHEN 325 MG PO TABS
650.0000 mg | ORAL_TABLET | ORAL | Status: AC | PRN
Start: 2023-03-19 — End: ?
  Administered 2023-03-20 – 2023-03-22 (×4): 650 mg via ORAL
  Filled 2023-03-19 (×4): qty 2

## 2023-03-19 MED ORDER — HEPARIN SODIUM (PORCINE) 5000 UNIT/ML IJ SOLN: 4000.0000 [IU] | Freq: Once | INTRAMUSCULAR | Status: DC

## 2023-03-19 MED ORDER — ENOXAPARIN SODIUM 40 MG/0.4ML IJ SOSY
40.0000 mg | PREFILLED_SYRINGE | INTRAMUSCULAR | Status: DC
  Administered 2023-03-20 – 2023-03-21 (×2): 40 mg via SUBCUTANEOUS
  Filled 2023-03-19 (×2): qty 0.4

## 2023-03-19 MED ORDER — DONEPEZIL HCL 10 MG PO TABS
10.0000 mg | ORAL_TABLET | Freq: Every day | ORAL | Status: DC
  Administered 2023-03-19 – 2023-03-21 (×3): 10 mg via ORAL
  Filled 2023-03-19 (×3): qty 1

## 2023-03-19 MED ORDER — LIDOCAINE HCL (PF) 1 % IJ SOLN
INTRAMUSCULAR | Status: DC | PRN
  Administered 2023-03-19: 2 mL

## 2023-03-19 MED ORDER — IOHEXOL 350 MG/ML SOLN
INTRAVENOUS | Status: DC | PRN
  Administered 2023-03-19: 145 mL via INTRA_ARTERIAL

## 2023-03-19 MED ORDER — ONDANSETRON HCL 4 MG/2ML IJ SOLN: 4.0000 mg | Freq: Four times a day (QID) | INTRAMUSCULAR | Status: DC | PRN

## 2023-03-19 MED ORDER — ADULT MULTIVITAMIN W/MINERALS CH
1.0000 | ORAL_TABLET | Freq: Every day | ORAL | Status: DC
  Administered 2023-03-20 – 2023-03-22 (×2): 1 via ORAL
  Filled 2023-03-19 (×2): qty 1

## 2023-03-19 MED ORDER — TICAGRELOR 90 MG PO TABS
ORAL_TABLET | ORAL | Status: AC
Start: 2023-03-19 — End: ?
  Filled 2023-03-19: qty 2

## 2023-03-19 MED ORDER — NITROGLYCERIN IN D5W 200-5 MCG/ML-% IV SOLN
0.0000 ug/min | INTRAVENOUS | Status: DC
  Administered 2023-03-19: 3 ug/min via INTRAVENOUS

## 2023-03-19 MED ORDER — ASPIRIN 81 MG PO TBEC
81.0000 mg | DELAYED_RELEASE_TABLET | Freq: Every day | ORAL | Status: DC
  Administered 2023-03-20 – 2023-03-22 (×3): 81 mg via ORAL
  Filled 2023-03-19 (×4): qty 1

## 2023-03-19 MED ORDER — ATORVASTATIN CALCIUM 80 MG PO TABS
80.0000 mg | ORAL_TABLET | Freq: Every day | ORAL | Status: DC
  Administered 2023-03-20 – 2023-03-22 (×3): 80 mg via ORAL
  Filled 2023-03-19 (×3): qty 1

## 2023-03-19 MED ORDER — NITROGLYCERIN 1 MG/10 ML FOR IR/CATH LAB
INTRA_ARTERIAL | Status: AC
  Filled 2023-03-19: qty 10

## 2023-03-19 MED ORDER — NITROGLYCERIN 0.4 MG SL SUBL: 0.4000 mg | SUBLINGUAL_TABLET | SUBLINGUAL | Status: DC | PRN

## 2023-03-19 MED ORDER — ACETAMINOPHEN 325 MG PO TABS
650.0000 mg | ORAL_TABLET | ORAL | Status: DC | PRN
Start: 2023-03-19 — End: 2023-03-19

## 2023-03-19 MED ORDER — HEPARIN SODIUM (PORCINE) 1000 UNIT/ML IJ SOLN
INTRAMUSCULAR | Status: AC
  Filled 2023-03-19: qty 10

## 2023-03-19 MED ORDER — VERAPAMIL HCL 2.5 MG/ML IV SOLN
INTRAVENOUS | Status: DC | PRN
  Administered 2023-03-19: 10 mL via INTRA_ARTERIAL

## 2023-03-19 MED ORDER — PANTOPRAZOLE SODIUM 20 MG PO TBEC
20.0000 mg | DELAYED_RELEASE_TABLET | Freq: Every day | ORAL | Status: DC
  Administered 2023-03-20 – 2023-03-22 (×3): 20 mg via ORAL
  Filled 2023-03-19 (×3): qty 1

## 2023-03-19 MED ORDER — ESCITALOPRAM OXALATE 10 MG PO TABS
20.0000 mg | ORAL_TABLET | Freq: Every day | ORAL | Status: DC
  Administered 2023-03-20 – 2023-03-22 (×3): 20 mg via ORAL
  Filled 2023-03-19 (×3): qty 2

## 2023-03-19 MED ORDER — TICAGRELOR 90 MG PO TABS
ORAL_TABLET | ORAL | Status: DC | PRN
  Administered 2023-03-19: 180 mg via ORAL

## 2023-03-19 MED ORDER — HYDRALAZINE HCL 20 MG/ML IJ SOLN: 10.0000 mg | INTRAMUSCULAR | Status: AC | PRN

## 2023-03-19 MED ORDER — LABETALOL HCL 5 MG/ML IV SOLN
10.0000 mg | INTRAVENOUS | Status: AC | PRN
Start: 2023-03-19 — End: 2023-03-20

## 2023-03-19 SURGICAL SUPPLY — 19 items
BALLN EMERGE MR 2.5X15 (BALLOONS) ×1
BALLN SCOREFLEX 3.50X15 (BALLOONS) ×1
BALLN ~~LOC~~ EMERGE MR 4.0X12 (BALLOONS) ×1
BALLOON EMERGE MR 2.5X15 (BALLOONS) IMPLANT
BALLOON SCOREFLEX 3.50X15 (BALLOONS) IMPLANT
BALLOON ~~LOC~~ EMERGE MR 4.0X12 (BALLOONS) IMPLANT
CATH 5FR JL3.5 JR4 ANG PIG MP (CATHETERS) IMPLANT
CATH INFINITI 5 FR 3DRC (CATHETERS) IMPLANT
CATH INFINITI 5FR AL1 (CATHETERS) IMPLANT
CATH LAUNCHER 6FR EBU3.5 (CATHETERS) IMPLANT
CATH OPTICROSS HD (CATHETERS) IMPLANT
DEVICE RAD COMP TR BAND LRG (VASCULAR PRODUCTS) IMPLANT
GUIDEWIRE INQWIRE 1.5J.035X260 (WIRE) IMPLANT
INQWIRE 1.5J .035X260CM (WIRE) ×1
KIT ENCORE 26 ADVANTAGE (KITS) IMPLANT
KIT SYRINGE INJ CVI SPIKEX1 (MISCELLANEOUS) IMPLANT
PACK CARDIAC CATHETERIZATION (CUSTOM PROCEDURE TRAY) ×1 IMPLANT
SLED PULL BACK IVUS (MISCELLANEOUS) IMPLANT
WIRE ASAHI PROWATER 180CM (WIRE) IMPLANT

## 2023-03-19 NOTE — ED Triage Notes (Signed)
Pt BIB [facility] as a code STEMI. Pt was watching the game, and started having CP that is similar to the CP pain when he had his heart attack in the past. 324 ASA and 1 nitroglycerin given by EMS. A&O X4.

## 2023-03-19 NOTE — Progress Notes (Signed)
   03/19/23 1640  Spiritual Encounters  Type of Visit Declined chaplain visit  Referral source Code page (STEMI)  Reason for visit Code  OnCall Visit Yes   Chaplain met with patient as well as his family. Family and patient stated they had no needs at this time. Chaplain informed them to let staff know if they need spiritual care at anytime.   Arlyce Dice, Chaplain Resident

## 2023-03-19 NOTE — ED Provider Notes (Signed)
[place] EMERGENCY DEPARTMENT AT [facility] Provider Note   CSN: [id number] Arrival date & time: 03/19/23  1616     History {Add pertinent medical, surgical, social history, OB history to HPI:1} Chief Complaint  Patient presents with   Code STEMI    Aaron Blackburn is a 62 y.o. male.  The history is provided by the patient and medical records. No language interpreter was used.  Chest Pain Pain location:  Substernal area and L chest Pain quality: aching, crushing and pressure   Pain radiates to:  L shoulder Pain severity:  Severe Onset quality:  Sudden Duration:  1 hour Timing:  Constant Progression:  Improving Chronicity:  Recurrent Relieved by:  Nothing Worsened by:  Nothing Ineffective treatments:  None tried Associated symptoms: shortness of breath   Associated symptoms: no abdominal pain, no back pain, no cough, no diaphoresis, no fatigue, no fever, no headache, no nausea, no palpitations and no vomiting        Home Medications Prior to Admission medications   Medication Sig Start Date End Date Taking? Authorizing Provider  Apoaequorin (PREVAGEN) 10 MG CAPS Take 1 tablet by mouth daily.    [provider]  ASHWAGANDHA PO Take 700 mg by mouth daily.    [provider]  aspirin 81 MG tablet Take 81 mg by mouth daily.    [provider]  atorvastatin (LIPITOR) 80 MG tablet Take 80 mg by mouth daily. 11/21/22   [provider]  carvedilol (COREG) 12.5 MG tablet Take 1 tablet (12.5 mg total) by mouth 2 (two) times daily with a meal. 04/30/16   Crenshaw, Madolyn Frieze, MD  CRESTOR 40 MG tablet TAKE 1 TABLET (40 MG TOTAL) BY MOUTH DAILY. 02/06/16   Lewayne Bunting, MD  cyanocobalamin (VITAMIN B12) 500 MCG tablet Take 500 mcg by mouth daily.    [provider]  donepezil (ARICEPT) 10 MG tablet Take 1 tablet (10 mg total) by mouth at bedtime. 02/26/23   Levert Feinstein, MD  escitalopram (LEXAPRO) 20 MG tablet Take 1 tablet (20 mg  total) by mouth daily. 11/13/22   Levert Feinstein, MD  Inulin (FIBER CHOICE PO) Take 1 tablet by mouth daily.    [provider]  lisinopril (ZESTRIL) 10 MG tablet Take 10 mg by mouth daily. 11/21/22   [provider]  Multiple Vitamin (MULTIVITAMIN WITH MINERALS) TABS tablet Take 1 tablet by mouth daily.    [provider]  nitroGLYCERIN (NITROSTAT) 0.4 MG SL tablet Place 0.4 mg under the tongue every 5 (five) minutes as needed.    [provider]  omega-3 acid ethyl esters (LOVAZA) 1 g capsule Take 1 g by mouth daily.    [provider]  pantoprazole (PROTONIX) 20 MG tablet Take 1 tablet (20 mg total) by mouth daily. 06/30/20   Rolan Bucco, MD      Allergies    Patient has no known allergies.    Review of Systems   Review of Systems  Constitutional:  Negative for chills, diaphoresis, fatigue and fever.  HENT:  Negative for congestion.   Respiratory:  Positive for shortness of breath. Negative for cough and chest tightness.   Cardiovascular:  Positive for chest pain. Negative for palpitations.  Gastrointestinal:  Negative for abdominal pain, constipation, diarrhea, nausea and vomiting.  Genitourinary:  Negative for dysuria.  Musculoskeletal:  Negative for back pain, neck pain and neck stiffness.  Skin:  Negative for rash and wound.  Neurological:  Negative for  headaches.  Psychiatric/Behavioral:  Negative for agitation.   All other systems reviewed and are negative.   Physical Exam Updated Vital Signs BP (!) 150/95   Pulse 62   Resp 15   SpO2 98%  Physical Exam Vitals and nursing note reviewed.  Constitutional:      General: He is not in acute distress.    Appearance: He is well-developed. He is not ill-appearing, toxic-appearing or diaphoretic.  HENT:     Head: Normocephalic and atraumatic.     Nose: No congestion or rhinorrhea.     Mouth/Throat:     Pharynx: No oropharyngeal exudate.  Eyes:     Extraocular Movements: Extraocular  movements intact.     Conjunctiva/sclera: Conjunctivae normal.     Pupils: Pupils are equal, round, and reactive to light.  Cardiovascular:     Rate and Rhythm: Normal rate and regular rhythm.     Heart sounds: No murmur heard. Pulmonary:     Effort: Pulmonary effort is normal. No respiratory distress.     Breath sounds: Normal breath sounds. No wheezing, rhonchi or rales.  Chest:     Chest wall: No tenderness.  Abdominal:     General: Abdomen is flat.     Palpations: Abdomen is soft.     Tenderness: There is no abdominal tenderness.  Musculoskeletal:        General: No swelling or tenderness.     Cervical back: Neck supple. No tenderness.  Skin:    General: Skin is warm and dry.     Capillary Refill: Capillary refill takes less than 2 seconds.     Findings: No erythema.  Neurological:     Mental Status: He is alert.  Psychiatric:        Mood and Affect: Mood normal.     ED Results / Procedures / Treatments   Labs (all labs ordered are listed, but only abnormal results are displayed) Labs Reviewed  CBC WITH DIFFERENTIAL/PLATELET - Abnormal; Notable for the following components:      Result Value   Hemoglobin 12.9 (*)    HCT 37.3 (*)    All other components within normal limits  CG4 I-STAT (LACTIC ACID) - Abnormal; Notable for the following components:   Lactic Acid, Venous 0.4 (*)    All other components within normal limits  POCT I-STAT, CHEM 8 - Abnormal; Notable for the following components:   Glucose, Bld 125 (*)    Hemoglobin 11.9 (*)    HCT 35.0 (*)    All other components within normal limits  COMPREHENSIVE METABOLIC PANEL  LACTIC ACID, PLASMA    EKG EKG Interpretation Date/Time:  Wednesday March 19 2023 16:23:01 EST Ventricular Rate:  57 PR Interval:  162 QRS Duration:  110 QT Interval:  451 QTC Calculation: 440 R Axis:   34  Text Interpretation: Sinus rhythm ST depression, consider ischemia, lateral lds when compared to prior, similar appearance  and ST elevations from EMS are less pronounced. Confirmed by Theda Belfast ([id number]) on 03/19/2023 4:29:45 PM  Radiology No results found.  Procedures Procedures  {Document cardiac monitor, telemetry assessment procedure when appropriate:1}  CRITICAL CARE Performed by: Canary Brim Onell Mcmath Total critical care time: 25 minutes Critical care time was exclusive of separately billable procedures and treating other patients. Critical care was necessary to treat or prevent imminent or life-threatening deterioration. Critical care was time spent personally by me on the following activities: development of treatment plan with patient and/or surrogate as well as nursing, discussions with  consultants, evaluation of patient's response to treatment, examination of patient, obtaining history from patient or surrogate, ordering and performing treatments and interventions, ordering and review of laboratory studies, ordering and review of radiographic studies, pulse oximetry and re-evaluation of patient's condition.  Medications Ordered in ED Medications  heparin injection 4,000 Units ( Intravenous MAR Hold 03/19/23 1646)  nitroGLYCERIN 50 mg in dextrose 5 % 250 mL (0.2 mg/mL) infusion ( Intravenous MAR Hold 03/19/23 1646)  nitroGLYCERIN 100 mcg/mL intra-arterial injection (has no administration in time range)    ED Course/ Medical Decision Making/ A&P   {   Click here for ABCD2, HEART and other calculatorsREFRESH Note before signing :1}                              Medical Decision Making Risk Prescription drug management.    Aaron Blackburn is a 62 y.o. male with a past medical history significant for hypertension, hyperlipidemia, GERD, and CAD with previous PCI who presents with sudden chest pain and shortness of breath.  According to patient, he was watching NFL football game today within the last hour or so when short having crushing central chest pain going to his left shoulder.  He reports it  feels similar to the pain he had when he had his previous MI.  He reports some shortness of breath with it.  He called for help and he was given aspirin and nitro that improved his pain.  Reports the pain was an 8 out of 10 at its worst and is now a 4 out of 10.  On my last assessment patient's pain had nearly resolved.  On arrival, patient airway was intact.  Breath sounds are good bilaterally.  He did not have any chest tenderness or murmur on my exam.  Chest and abdomen were nontender.  Good pulses in extremities.  The patient is not in acute distress now and is not diaphoretic.  EKG with EMS did show ST elevations inferiorly with some reciprocal changes.  Cardiology came to the bedside right when the patient came to the emergency department and looked at the EKGs.  They are concerned about STEMI although the EKG with [place] on arrival does appear to have improved significantly.  Per cardiology he will go to the Cath Lab for STEMI intervention and they will admit him.   Patient taken to Cath Lab for further intervention and management.  {Document critical care time when appropriate:1} {Document review of labs and clinical decision tools ie heart score, Chads2Vasc2 etc:1}  {Document your independent review of radiology images, and any outside records:1} {Document your discussion with family members, caretakers, and with consultants:1} {Document social determinants of health affecting pt's care:1} {Document your decision making why or why not admission, treatments were needed:1} Final Clinical Impression(s) / ED Diagnoses Final diagnoses:  Acute ST elevation myocardial infarction (STEMI), unspecified artery (HCC)     Clinical Impression: 1. Acute ST elevation myocardial infarction (STEMI), unspecified artery (HCC)     Disposition: Admit  This note was prepared with assistance of Dragon voice recognition software. Occasional wrong-word or sound-a-like substitutions may have occurred due to  the inherent limitations of voice recognition software.

## 2023-03-19 NOTE — H&P (Addendum)
Cardiology Admission History and Physical   Patient ID: SOULEYMANE HISHMEH MRN: [id number]; DOB: December 11, 1960   Admission date: 03/19/2023  PCP:  Eartha Inch, MD   [facility] Providers Cardiologist:  None        Chief Complaint:  Chest pain. Inferior STEMI  Patient Profile:   Aaron Blackburn is a 62 y.o. male with HTN, HL CAD s/p previous stents to RCA and LCX in 2008 who is being seen 03/19/2023 for the evaluation of chest pain inferior STEMI.  History of Present Illness:   62 y/o male as above. Had PCI of mRCA (2.75 x 20mm Liberte) and LCX (3.5 x 16mm Taxus) in 2008 at [facility]. Has done well since. Exercises regualrly with no CP. Stopped ASA about 2 years ago.   Was watching football at 315p developed acute onset severe CP. Called 911. ECG with inferior ST elevation. STEMI activated. Given ASA and NTG.   On arrival to ER CP was down to 3/10 but began to wax and wane. Started heparin and NTG drip.   Taken emergently yo cath lab with Dr. [place]    Past Medical History:  Diagnosis Date   CAD (coronary artery disease)    GERD (gastroesophageal reflux disease)    Hyperlipidemia    Hypertension    Myocardial infarction [facility])    Obesity     Past Surgical History:  Procedure Laterality Date   CARDIAC CATHETERIZATION     TONSILLECTOMY       Medications Prior to Admission: Prior to Admission medications   Medication Sig Start Date End Date Taking? Authorizing Provider  Apoaequorin (PREVAGEN) 10 MG CAPS Take 1 tablet by mouth daily.    [provider]  ASHWAGANDHA PO Take 700 mg by mouth daily.    [provider]  aspirin 81 MG tablet Take 81 mg by mouth daily.    [provider]  atorvastatin (LIPITOR) 80 MG tablet Take 80 mg by mouth daily. 11/21/22   [provider]  carvedilol (COREG) 12.5 MG tablet Take 1 tablet (12.5 mg total) by mouth 2 (two) times daily with a meal. 04/30/16   Crenshaw, Madolyn Frieze, MD   CRESTOR 40 MG tablet TAKE 1 TABLET (40 MG TOTAL) BY MOUTH DAILY. 02/06/16   Lewayne Bunting, MD  cyanocobalamin (VITAMIN B12) 500 MCG tablet Take 500 mcg by mouth daily.    [provider]  donepezil (ARICEPT) 10 MG tablet Take 1 tablet (10 mg total) by mouth at bedtime. 02/26/23   Levert Feinstein, MD  escitalopram (LEXAPRO) 20 MG tablet Take 1 tablet (20 mg total) by mouth daily. 11/13/22   Levert Feinstein, MD  Inulin (FIBER CHOICE PO) Take 1 tablet by mouth daily.    [provider]  lisinopril (ZESTRIL) 10 MG tablet Take 10 mg by mouth daily. 11/21/22   [provider]  Multiple Vitamin (MULTIVITAMIN WITH MINERALS) TABS tablet Take 1 tablet by mouth daily.    [provider]  nitroGLYCERIN (NITROSTAT) 0.4 MG SL tablet Place 0.4 mg under the tongue every 5 (five) minutes as needed.    [provider]  omega-3 acid ethyl esters (LOVAZA) 1 g capsule Take 1 g by mouth daily.    [provider]  pantoprazole (PROTONIX) 20 MG tablet Take 1 tablet (20 mg total) by mouth daily. 06/30/20   Rolan Bucco, MD     Allergies:   No Known Allergies  Social History:   Social History  Socioeconomic History   Marital status: Married    Spouse name: [name]   Number of children: 2   Years of education: Not on file   Highest education level: 12th grade  Occupational History    Employer: [employer]  Tobacco Use   Smoking status: Former   Smokeless tobacco: Never  Vaping Use   Vaping status: Never Used  Substance and Sexual Activity   Alcohol use: Yes    Alcohol/week: 5.0 standard drinks of alcohol    Types: 5 Standard drinks or equivalent per week    Comment: Occasional   Drug use: No   Sexual activity: Yes    Birth control/protection: None  Other Topics Concern   Not on file  Social History Narrative   Not on file   Social Drivers of Health   Financial Resource Strain: Low Risk  (08/23/2022)   Received from [facility], [facility]    Overall Financial Resource Strain (CARDIA)    Difficulty of Paying Living Expenses: Not hard at all  Food Insecurity: No Food Insecurity (08/23/2022)   Received from [facility], [facility]   Hunger Vital Sign    Worried About Running Out of Food in the Last Year: Never true    Ran Out of Food in the Last Year: Never true  Transportation Needs: No Transportation Needs (08/23/2022)   Received from [employer], [facility]   PRAPARE - Transportation    Lack of Transportation (Medical): No    Lack of Transportation (Non-Medical): No  Physical Activity: Insufficiently Active (08/23/2022)   Received from [facility], [facility]   Exercise Vital Sign    Days of Exercise per Week: 2 days    Minutes of Exercise per Session: 30 min  Stress: No Stress Concern Present (08/23/2022)   Received from [facility] - Occupational Stress Questionnaire    Feeling of Stress : Not at all  Social Connections: Socially Integrated (08/23/2022)   Received from [facility], [facility]   Social Network    How would you rate your social network (family, work, friends)?: Good participation with social networks  Intimate Partner Violence: Not At Risk (08/23/2022)   Received from [facility], [facility]   HITS    Over the last 12 months how often did your partner physically hurt you?: Never    Over the last 12 months how often did your partner insult you or talk down to you?: Never    Over the last 12 months how often did your partner threaten you with physical harm?: Never    Over the last 12 months how often did your partner scream or curse at you?: Never    Family History:   The patient's family history includes Coronary artery disease in his mother.    ROS:  Please see the history of present illness.  All other ROS reviewed and negative.     Physical Exam/Data:   Vitals:   03/19/23 1626  BP: (!) 150/95  Pulse: 62  Resp:  15  SpO2: 98%  Weight: 81.6 kg  Height: 5\' 8"  (1.727 m)   No intake or output data in the 24 hours ending 03/19/23 1653    03/19/2023    4:26 PM 01/09/2023   10:04 AM 11/13/2022    1:43 PM  Last 3 Weights  Weight (lbs) 180 lb 187 lb 9.6 oz 179 lb  Weight (kg) 81.647 kg 85.095 kg 81.194 kg  Body mass index is 27.37 kg/m.  General:  Well appearing. No resp difficulty HEENT: normal Neck: supple. no JVD. Carotids 2+ bilat; no bruits. No lymphadenopathy or thryomegaly appreciated. Cor: PMI nondisplaced. Regular rate & rhythm. No rubs, gallops or murmurs. Lungs: clear Abdomen: soft, nontender, nondistended. No hepatosplenomegaly. No bruits or masses. Good bowel sounds. Extremities: no cyanosis, clubbing, rash, edema Neuro: alert & orientedx3, cranial nerves grossly intact. moves all 4 extremities w/o difficulty. Affect pleasant   EKG:  Sinus brady 51 inferior ST elevation with high lateral depression Personally reviewed   Relevant CV Studies:   Laboratory Data:  High Sensitivity Troponin:  No results for input(s): "TROPONINIHS" in the last 720 hours.    ChemistryNo results for input(s): "NA", "K", "CL", "CO2", "GLUCOSE", "BUN", "CREATININE", "CALCIUM", "MG", "GFRNONAA", "GFRAA", "ANIONGAP" in the last 168 hours.  No results for input(s): "PROT", "ALBUMIN", "AST", "ALT", "ALKPHOS", "BILITOT" in the last 168 hours. Lipids No results for input(s): "CHOL", "TRIG", "HDL", "LABVLDL", "LDLCALC", "CHOLHDL" in the last 168 hours. HematologyNo results for input(s): "WBC", "RBC", "HGB", "HCT", "MCV", "MCH", "MCHC", "RDW", "PLT" in the last 168 hours. Thyroid No results for input(s): "TSH", "FREET4" in the last 168 hours. BNPNo results for input(s): "BNP", "PROBNP" in the last 168 hours.  DDimer No results for input(s): "DDIMER" in the last 168 hours.   Radiology/Studies:  No results found.   Assessment and Plan:   1. CAD with acute inferior STEMI - previous h/o RCA and LCX stents  in 2008 - has gotten heparin/ASA/NTG - emergent cath - consult CR post-cath  2. HTN - well controlled  3. HL - check lipid panel. - continue crestor 40 - goal LDL < 70     Risk Assessment/Risk Scores:    TIMI Risk Score for ST  Elevation MI:   The patient's TIMI risk score is  , which indicates a  % risk of all cause mortality at 30 days.       Code Status: Full Code  Severity of Illness: The appropriate patient status for this patient is INPATIENT. Inpatient status is judged to be reasonable and necessary in order to provide the required intensity of service to ensure the patient's safety. The patient's presenting symptoms, physical exam findings, and initial radiographic and laboratory data in the context of their chronic comorbidities is felt to place them at high risk for further clinical deterioration. Furthermore, it is not anticipated that the patient will be medically stable for discharge from the hospital within 2 midnights of admission.   * I certify that at the point of admission it is my clinical judgment that the patient will require inpatient hospital care spanning beyond 2 midnights from the point of admission due to high intensity of service, high risk for further deterioration and high frequency of surveillance required.*   For questions or updates, please contact [facility] Please consult [email or web address] for contact info under     Signed, Arvilla Meres, MD  03/19/2023 4:53 PM

## 2023-03-20 ENCOUNTER — Inpatient Hospital Stay (HOSPITAL_COMMUNITY): Payer: Managed Care, Other (non HMO)

## 2023-03-20 ENCOUNTER — Other Ambulatory Visit (HOSPITAL_COMMUNITY): Payer: Self-pay

## 2023-03-20 ENCOUNTER — Encounter (HOSPITAL_COMMUNITY): Payer: Self-pay | Admitting: Cardiology

## 2023-03-20 ENCOUNTER — Other Ambulatory Visit: Payer: Self-pay

## 2023-03-20 DIAGNOSIS — E876 Hypokalemia: Secondary | ICD-10-CM

## 2023-03-20 DIAGNOSIS — I2510 Atherosclerotic heart disease of native coronary artery without angina pectoris: Secondary | ICD-10-CM

## 2023-03-20 DIAGNOSIS — I10 Essential (primary) hypertension: Secondary | ICD-10-CM

## 2023-03-20 DIAGNOSIS — E7800 Pure hypercholesterolemia, unspecified: Secondary | ICD-10-CM

## 2023-03-20 LAB — TROPONIN I (HIGH SENSITIVITY)
Troponin I (High Sensitivity): 6502 ng/L (ref <18)
Troponin I (High Sensitivity): 5883 ng/L (ref <18)

## 2023-03-20 LAB — ECHOCARDIOGRAM COMPLETE
AR max vel: 2.69 cm2
AV Area VTI: 2.79 cm2
AV Area mean vel: 2.38 cm2
AV Mean grad: 4 mm[Hg]
AV Peak grad: 7 mm[Hg]
Ao pk vel: 1.32 m/s
Area-P 1/2: 2.78 cm2
Height: 68 in
S' Lateral: 2.6 cm
Weight: 2880 [oz_av]

## 2023-03-20 LAB — LIPID PANEL
Cholesterol: 121 mg/dL (ref 0–200)
HDL: 33 mg/dL — ABNORMAL LOW (ref >40)
LDL Cholesterol: 77 mg/dL (ref 0–99)
Total CHOL/HDL Ratio: 3.7 {ratio}
Triglycerides: 57 mg/dL (ref <150)
VLDL: 11 mg/dL (ref 0–40)

## 2023-03-20 LAB — CBC
HCT: 37.6 % — ABNORMAL LOW (ref 39.0–52.0)
Hemoglobin: 13.1 g/dL (ref 13.0–17.0)
MCH: 28.6 pg (ref 26.0–34.0)
MCHC: 34.8 g/dL (ref 30.0–36.0)
MCV: 82.1 fL (ref 80.0–100.0)
Platelets: 176 10*3/uL (ref 150–400)
RBC: 4.58 MIL/uL (ref 4.22–5.81)
RDW: 13.3 % (ref 11.5–15.5)
WBC: 10.4 10*3/uL (ref 4.0–10.5)
nRBC: 0 % (ref 0.0–0.2)

## 2023-03-20 LAB — BASIC METABOLIC PANEL
Anion gap: 8 (ref 5–15)
BUN: 9 mg/dL (ref 8–23)
CO2: 26 mmol/L (ref 22–32)
Calcium: 8.7 mg/dL — ABNORMAL LOW (ref 8.9–10.3)
Chloride: 104 mmol/L (ref 98–111)
Creatinine, Ser: 0.69 mg/dL (ref 0.61–1.24)
GFR, Estimated: >60 mL/min (ref >60)
Glucose, Bld: 113 mg/dL — ABNORMAL HIGH (ref 70–99)
Potassium: 3.3 mmol/L — ABNORMAL LOW (ref 3.5–5.1)
Sodium: 138 mmol/L (ref 135–145)

## 2023-03-20 LAB — POCT ACTIVATED CLOTTING TIME: Activated Clotting Time: 429 s

## 2023-03-20 MED ORDER — ISOSORBIDE MONONITRATE ER 30 MG PO TB24
15.0000 mg | ORAL_TABLET | Freq: Every day | ORAL | Status: DC
  Administered 2023-03-20 – 2023-03-22 (×3): 15 mg via ORAL
  Filled 2023-03-20 (×3): qty 1

## 2023-03-20 MED ORDER — POTASSIUM CHLORIDE CRYS ER 20 MEQ PO TBCR
40.0000 meq | EXTENDED_RELEASE_TABLET | Freq: Two times a day (BID) | ORAL | Status: AC
  Administered 2023-03-20 (×2): 40 meq via ORAL
  Filled 2023-03-20 (×2): qty 2

## 2023-03-20 MED ORDER — CHLORHEXIDINE GLUCONATE CLOTH 2 % EX PADS
6.0000 | MEDICATED_PAD | Freq: Every day | CUTANEOUS | Status: DC
  Administered 2023-03-20 – 2023-03-22 (×3): 6 via TOPICAL

## 2023-03-20 MED ORDER — POTASSIUM CHLORIDE 20 MEQ PO PACK: 40.0000 meq | PACK | Freq: Two times a day (BID) | ORAL | Status: DC

## 2023-03-20 MED FILL — Nitroglycerin IV Soln 100 MCG/ML in D5W: INTRA_ARTERIAL | Qty: 10 | Status: AC

## 2023-03-20 NOTE — Plan of Care (Signed)
   Problem: Education: Goal: Knowledge of General Education information will improve Description: Including pain rating scale, medication(s)/side effects and non-pharmacologic comfort measures Outcome: Progressing   Problem: Health Behavior/Discharge Planning: Goal: Ability to manage health-related needs will improve Outcome: Progressing   Problem: Clinical Measurements: Goal: Ability to maintain clinical measurements within normal limits will improve Outcome: Progressing Goal: Will remain free from infection Outcome: Progressing Goal: Diagnostic test results will improve Outcome: Progressing Goal: Respiratory complications will improve Outcome: Progressing Goal: Cardiovascular complication will be avoided Outcome: Progressing   Problem: Activity: Goal: Risk for activity intolerance will decrease Outcome: Progressing   Problem: Nutrition: Goal: Adequate nutrition will be maintained Outcome: Progressing   Problem: Coping: Goal: Level of anxiety will decrease Outcome: Progressing   Problem: Elimination: Goal: Will not experience complications related to bowel motility Outcome: Progressing Goal: Will not experience complications related to urinary retention Outcome: Progressing   Problem: Pain Management: Goal: General experience of comfort will improve Outcome: Progressing   Problem: Safety: Goal: Ability to remain free from injury will improve Outcome: Progressing   Problem: Skin Integrity: Goal: Risk for impaired skin integrity will decrease Outcome: Progressing   Problem: Education: Goal: Understanding of cardiac disease, CV risk reduction, and recovery process will improve Outcome: Progressing Goal: Individualized Educational Video(s) Outcome: Progressing   Problem: Activity: Goal: Ability to tolerate increased activity will improve Outcome: Progressing   Problem: Cardiac: Goal: Ability to achieve and maintain adequate cardiovascular perfusion will  improve Outcome: Progressing   Problem: Health Behavior/Discharge Planning: Goal: Ability to safely manage health-related needs after discharge will improve Outcome: Progressing   Problem: Education: Goal: Understanding of CV disease, CV risk reduction, and recovery process will improve Outcome: Progressing Goal: Individualized Educational Video(s) Outcome: Progressing   Problem: Activity: Goal: Ability to return to baseline activity level will improve Outcome: Progressing   Problem: Cardiovascular: Goal: Ability to achieve and maintain adequate cardiovascular perfusion will improve Outcome: Progressing Goal: Vascular access site(s) Level 0-1 will be maintained Outcome: Progressing   Problem: Health Behavior/Discharge Planning: Goal: Ability to safely manage health-related needs after discharge will improve Outcome: Progressing

## 2023-03-20 NOTE — Progress Notes (Signed)
Rounding Note    Patient Name: Aaron Blackburn Date of Encounter: 03/20/2023  [facility] Cardiologist: Patient had seen Dr. [name] in the past; Dr. Cyndia Bent for primary care  Subjective   No recurrent chest pain  Inpatient Medications    Scheduled Meds:  aspirin EC  81 mg Oral Daily   atorvastatin  80 mg Oral Daily   Chlorhexidine Gluconate Cloth  6 each Topical Daily   donepezil  10 mg Oral QHS   enoxaparin (LOVENOX) injection  40 mg Subcutaneous Q24H   escitalopram  20 mg Oral Daily   metoprolol tartrate  25 mg Oral BID   multivitamin with minerals  1 tablet Oral Daily   pantoprazole  20 mg Oral Daily   sodium chloride flush  3 mL Intravenous Q12H   ticagrelor  90 mg Oral BID   Continuous Infusions:  sodium chloride     PRN Meds: sodium chloride, acetaminophen, nitroGLYCERIN, ondansetron (ZOFRAN) IV, sodium chloride flush   Vital Signs    Vitals:   03/20/23 0300 03/20/23 0400 03/20/23 0500 03/20/23 0600  BP: 115/80 128/75 114/74 121/78  Pulse: (!) 55 (!) 53 (!) 54 (!) 52  Resp: 19 18 18 18   Temp:   98 F (36.7 C)   TempSrc:   Oral   SpO2: 94% 95% 95% 96%  Weight:      Height:        Intake/Output Summary (Last 24 hours) at 03/20/2023 0828 Last data filed at 03/20/2023 0640 Gross per 24 hour  Intake 27.59 ml  Output 2000 ml  Net -1972.41 ml      03/19/2023    4:26 PM 01/09/2023   10:04 AM 11/13/2022    1:43 PM  Last 3 Weights  Weight (lbs) 180 lb 187 lb 9.6 oz 179 lb  Weight (kg) 81.647 kg 85.095 kg 81.194 kg      Telemetry    Sinus in the mid 60s - Personally Reviewed  ECG    03/20/2023 ECG (independently read by me):  Sinus bradycardia at 55  03/19/2023 ECG (independently read by me): SB at 57 mild ST depression inferiorly  Physical Exam   GEN: No acute distress.   Neck: No JVD Cardiac: RRR, no murmurs, rubs, or gallops.  No chest wall tenderness. Respiratory: Clear to auscultation bilaterally. GI: Soft, nontender,  non-distended  Right radial cath site stable MS: No edema; No deformity. Neuro:  Nonfocal  Psych: Normal affect   Labs    High Sensitivity Troponin:  No results for input(s): "TROPONINIHS" in the last 720 hours.   Chemistry Recent Labs  Lab 03/19/23 1746 03/19/23 1747 03/19/23 1908 03/20/23 0210  NA 138 135  --  138  K 3.6 3.5  --  3.3*  CL 100 104  --  104  CO2  --  23  --  26  GLUCOSE 125* 133*  --  113*  BUN 10 10  --  9  CREATININE 0.70 0.74 0.75 0.69  CALCIUM  --  8.1*  --  8.7*  PROT  --  5.1*  --   --   ALBUMIN  --  3.3*  --   --   AST  --  23  --   --   ALT  --  14  --   --   ALKPHOS  --  72  --   --   BILITOT  --  0.5  --   --   GFRNONAA  --  >  60 >60 >60  ANIONGAP  --  8  --  8    Lipids  Recent Labs  Lab 03/20/23 0210  CHOL 121  TRIG 57  HDL 33*  LDLCALC 77  CHOLHDL 3.7    Hematology Recent Labs  Lab 03/19/23 1747 03/19/23 1908 03/20/23 0210  WBC 9.8 10.5 10.4  RBC 4.56 5.02 4.58  HGB 12.9* 14.3 13.1  HCT 37.3* 41.7 37.6*  MCV 81.8 83.1 82.1  MCH 28.3 28.5 28.6  MCHC 34.6 34.3 34.8  RDW 13.3 13.3 13.3  PLT 188 176 176   Thyroid No results for input(s): "TSH", "FREET4" in the last 168 hours.  BNPNo results for input(s): "BNP", "PROBNP" in the last 168 hours.  DDimer No results for input(s): "DDIMER" in the last 168 hours.   Radiology    CARDIAC CATHETERIZATION Result Date: 03/19/2023   Prox RCA lesion is 90% stenosed.   Prox RCA to Mid RCA lesion is 40% stenosed.   Dist RCA lesion is 75% stenosed.   1st Mrg lesion is 99% stenosed.   Mid LAD lesion is 50% stenosed.   1st Diag lesion is 75% stenosed.   Balloon angioplasty was performed using a BALLN [place] EMERGE MR 4.0X12.   Post intervention, there is a 15% residual stenosis.   The left ventricular systolic function is normal.   LV end diastolic pressure is moderately elevated.   The left ventricular ejection fraction is greater than 65% by visual estimate.   Recommend uninterrupted dual  antiplatelet therapy with Aspirin 81mg  daily and Ticagrelor 90mg  twice daily for a minimum of 12 months (ACS-Class I recommendation). 2 vessel obstructive CAD.  Culprit is the first OM with in stent restenosis and acute thrombus Normal LV function Moderately elevated LVEDP Successful PCI of the first OM with IVUS guidance. Scoring balloon PTCA following by high pressure [place] balloon. Plan: DAPT for one year. Anticipate returning on Friday for complex PCI of the RCA    Cardiac Studies   CATH: 03/19/2023   Prox RCA lesion is 90% stenosed.   Prox RCA to Mid RCA lesion is 40% stenosed.   Dist RCA lesion is 75% stenosed.   1st Mrg lesion is 99% stenosed.   Mid LAD lesion is 50% stenosed.   1st Diag lesion is 75% stenosed.   Balloon angioplasty was performed using a BALLN [place] EMERGE MR 4.0X12.   Post intervention, there is a 15% residual stenosis.   The left ventricular systolic function is normal.   LV end diastolic pressure is moderately elevated.   The left ventricular ejection fraction is greater than 65% by visual estimate.   Recommend uninterrupted dual antiplatelet therapy with Aspirin 81mg  daily and Ticagrelor 90mg  twice daily for a minimum of 12 months (ACS-Class I recommendation).   2 vessel obstructive CAD.  Culprit is the first OM with in stent restenosis and acute thrombus Normal LV function Moderately elevated LVEDP Successful PCI of the first OM with IVUS guidance. Scoring balloon PTCA following by high pressure [place] balloon.    Plan: DAPT for one year. Anticipate returning on Friday for complex PCI of the RCA    Coronary Diagrams  Diagnostic Dominance: Right  Intervention    Patient Profile     Aaron Blackburn is a 62 y.o. male with HTN, HL CAD s/p previous stents to RCA and LCX in 2008 who was seen 03/19/2023 for the evaluation of chest pain inferior STEMI.   Assessment & Plan    Day 1 s/p  inferior STEMI: Patient underwent PCI to circumflex marginal vessel with PTCA with  score flex balloon.  Will check troponin levels, since it does not appear to have been sent.  Will initiate low-dose isosorbide 15 mg.  Apparently patient was on IV nitro yesterday but this was discontinued.  Currently on aspirin/Brilinta, metoprolol tartrate 25 mg twice a day Concomitant CAD: Patient has complex proximal mid distal RCA stenoses.  Plan complex intervention tomorrow. Hyperlipidemia: Target LDL less than 70, preferably less than 55.  Will check LP(a).  Currently on atorvastatin 80 mg.  LDL cholesterol 77 Hypokalemia: Potassium 3.3, will give 40 mill equivalents KCl now.  For questions or updates, please contact [facility] Please consult [email or web address] for contact info under        Signed, Nicki Gu[place], MD  03/20/2023, 8:28 AM

## 2023-03-21 ENCOUNTER — Encounter (HOSPITAL_COMMUNITY)
Admission: EM | Disposition: A | Discharge status: Home / Self Care | Payer: Self-pay | Source: Home / Self Care | Attending: Cardiology

## 2023-03-21 DIAGNOSIS — E7841 Elevated Lipoprotein(a): Secondary | ICD-10-CM

## 2023-03-21 HISTORY — PX: CORONARY LITHOTRIPSY: CATH118330

## 2023-03-21 HISTORY — PX: CORONARY STENT INTERVENTION: CATH118234

## 2023-03-21 HISTORY — PX: CORONARY ULTRASOUND/IVUS: CATH118244

## 2023-03-21 LAB — CBC
HCT: 39 % (ref 39.0–52.0)
HCT: 40.7 % (ref 39.0–52.0)
Hemoglobin: 13.5 g/dL (ref 13.0–17.0)
Hemoglobin: 13.8 g/dL (ref 13.0–17.0)
MCH: 28.5 pg (ref 26.0–34.0)
MCH: 28.4 pg (ref 26.0–34.0)
MCHC: 34.6 g/dL (ref 30.0–36.0)
MCHC: 33.9 g/dL (ref 30.0–36.0)
MCV: 82.5 fL (ref 80.0–100.0)
MCV: 83.7 fL (ref 80.0–100.0)
Platelets: 183 10*3/uL (ref 150–400)
Platelets: 194 10*3/uL (ref 150–400)
RBC: 4.73 MIL/uL (ref 4.22–5.81)
RBC: 4.86 MIL/uL (ref 4.22–5.81)
RDW: 13.3 % (ref 11.5–15.5)
RDW: 13.2 % (ref 11.5–15.5)
WBC: 9.6 10*3/uL (ref 4.0–10.5)
WBC: 12.7 10*3/uL — ABNORMAL HIGH (ref 4.0–10.5)
nRBC: 0 % (ref 0.0–0.2)
nRBC: 0 % (ref 0.0–0.2)

## 2023-03-21 LAB — COMPREHENSIVE METABOLIC PANEL
ALT: 19 U/L (ref 0–44)
AST: 37 U/L (ref 15–41)
Albumin: 3.4 g/dL — ABNORMAL LOW (ref 3.5–5.0)
Alkaline Phosphatase: 54 U/L (ref 38–126)
Anion gap: 9 (ref 5–15)
BUN: 12 mg/dL (ref 8–23)
CO2: 26 mmol/L (ref 22–32)
Calcium: 9 mg/dL (ref 8.9–10.3)
Chloride: 105 mmol/L (ref 98–111)
Creatinine, Ser: 0.81 mg/dL (ref 0.61–1.24)
GFR, Estimated: >60 mL/min (ref >60)
Glucose, Bld: 97 mg/dL (ref 70–99)
Potassium: 3.8 mmol/L (ref 3.5–5.1)
Sodium: 140 mmol/L (ref 135–145)
Total Bilirubin: 0.9 mg/dL (ref <1.2)
Total Protein: 5.5 g/dL — ABNORMAL LOW (ref 6.5–8.1)

## 2023-03-21 LAB — POCT ACTIVATED CLOTTING TIME
Activated Clotting Time: 377 s
Activated Clotting Time: 360 s

## 2023-03-21 LAB — CREATININE, SERUM
Creatinine, Ser: 0.85 mg/dL (ref 0.61–1.24)
GFR, Estimated: >60 mL/min (ref >60)

## 2023-03-21 LAB — LIPOPROTEIN A (LPA): Lipoprotein (a): 121.4 nmol/L — ABNORMAL HIGH (ref <75.0)

## 2023-03-21 SURGERY — CORONARY STENT INTERVENTION
Anesthesia: LOCAL

## 2023-03-21 MED ORDER — SODIUM CHLORIDE 0.9% FLUSH: 3.0000 mL | INTRAVENOUS | Status: DC | PRN

## 2023-03-21 MED ORDER — NOREPINEPHRINE 4 MG/250ML-% IV SOLN
INTRAVENOUS | Status: AC
Start: 2023-03-21 — End: ?
  Filled 2023-03-21: qty 250

## 2023-03-21 MED ORDER — ATROPINE SULFATE 1 MG/10ML IJ SOSY
PREFILLED_SYRINGE | INTRAMUSCULAR | Status: DC | PRN
  Administered 2023-03-21: 1 mg via INTRAVENOUS

## 2023-03-21 MED ORDER — VERAPAMIL HCL 2.5 MG/ML IV SOLN
INTRAVENOUS | Status: DC | PRN
  Administered 2023-03-21: 10 mL via INTRA_ARTERIAL

## 2023-03-21 MED ORDER — PHENYLEPHRINE HCL-NACL 20-0.9 MG/250ML-% IV SOLN
INTRAVENOUS | Status: AC | PRN
  Administered 2023-03-21: 240 ug via INTRAVENOUS

## 2023-03-21 MED ORDER — SODIUM CHLORIDE 0.9% FLUSH
3.0000 mL | Freq: Two times a day (BID) | INTRAVENOUS | Status: DC
  Administered 2023-03-21 – 2023-03-22 (×2): 3 mL via INTRAVENOUS

## 2023-03-21 MED ORDER — NITROGLYCERIN 1 MG/10 ML FOR IR/CATH LAB
INTRA_ARTERIAL | Status: AC
  Filled 2023-03-21: qty 10

## 2023-03-21 MED ORDER — IOHEXOL 350 MG/ML SOLN
INTRAVENOUS | Status: DC | PRN
  Administered 2023-03-21: 80 mL

## 2023-03-21 MED ORDER — POTASSIUM CHLORIDE CRYS ER 20 MEQ PO TBCR
20.0000 meq | EXTENDED_RELEASE_TABLET | Freq: Once | ORAL | Status: AC
  Administered 2023-03-21: 20 meq via ORAL
  Filled 2023-03-21: qty 1

## 2023-03-21 MED ORDER — LIDOCAINE HCL (PF) 1 % IJ SOLN
INTRAMUSCULAR | Status: AC
Start: 2023-03-21 — End: ?
  Filled 2023-03-21: qty 30

## 2023-03-21 MED ORDER — EZETIMIBE 10 MG PO TABS
10.0000 mg | ORAL_TABLET | Freq: Every day | ORAL | Status: DC
  Administered 2023-03-21 – 2023-03-22 (×2): 10 mg via ORAL
  Filled 2023-03-21 (×2): qty 1

## 2023-03-21 MED ORDER — NOREPINEPHRINE BITARTRATE 1 MG/ML IV SOLN
INTRAVENOUS | Status: DC | PRN
  Administered 2023-03-21: 10 ug/kg/min via INTRAVENOUS

## 2023-03-21 MED ORDER — SODIUM CHLORIDE 0.9 % IV SOLN
INTRAVENOUS | Status: AC | PRN
  Administered 2023-03-21: 300 mL via INTRAVENOUS

## 2023-03-21 MED ORDER — ATROPINE SULFATE 1 MG/10ML IJ SOSY
PREFILLED_SYRINGE | INTRAMUSCULAR | Status: AC
  Filled 2023-03-21: qty 10

## 2023-03-21 MED ORDER — MIDAZOLAM HCL 2 MG/2ML IJ SOLN
INTRAMUSCULAR | Status: AC
  Filled 2023-03-21: qty 2

## 2023-03-21 MED ORDER — ENOXAPARIN SODIUM 40 MG/0.4ML IJ SOSY: 40.0000 mg | PREFILLED_SYRINGE | INTRAMUSCULAR | Status: DC

## 2023-03-21 MED ORDER — SODIUM CHLORIDE 0.9 % IV SOLN: 250.0000 mL | INTRAVENOUS | Status: DC | PRN

## 2023-03-21 MED ORDER — MIDAZOLAM HCL 2 MG/2ML IJ SOLN
INTRAMUSCULAR | Status: DC | PRN
  Administered 2023-03-21: 2 mg via INTRAVENOUS

## 2023-03-21 MED ORDER — HEPARIN SODIUM (PORCINE) 1000 UNIT/ML IJ SOLN
INTRAMUSCULAR | Status: DC | PRN
  Administered 2023-03-21: 9000 [IU] via INTRAVENOUS

## 2023-03-21 MED ORDER — SODIUM CHLORIDE 0.9 % WEIGHT BASED INFUSION
3.0000 mL/kg/h | INTRAVENOUS | Status: DC
  Administered 2023-03-21: 3 mL/kg/h via INTRAVENOUS

## 2023-03-21 MED ORDER — LIDOCAINE HCL (PF) 1 % IJ SOLN
INTRAMUSCULAR | Status: DC | PRN
  Administered 2023-03-21: 2 mL via INTRADERMAL

## 2023-03-21 MED ORDER — NITROGLYCERIN 1 MG/10 ML FOR IR/CATH LAB
INTRA_ARTERIAL | Status: DC | PRN
  Administered 2023-03-21: 200 ug via INTRACORONARY

## 2023-03-21 MED ORDER — FENTANYL CITRATE (PF) 100 MCG/2ML IJ SOLN
INTRAMUSCULAR | Status: DC | PRN
  Administered 2023-03-21: 25 ug via INTRAVENOUS

## 2023-03-21 MED ORDER — VERAPAMIL HCL 2.5 MG/ML IV SOLN
INTRAVENOUS | Status: AC
Start: 2023-03-21 — End: ?
  Filled 2023-03-21: qty 2

## 2023-03-21 MED ORDER — SODIUM CHLORIDE 0.9 % WEIGHT BASED INFUSION
1.0000 mL/kg/h | INTRAVENOUS | Status: DC
  Administered 2023-03-21: 1 mL/kg/h via INTRAVENOUS

## 2023-03-21 MED ORDER — HEPARIN SODIUM (PORCINE) 1000 UNIT/ML IJ SOLN
INTRAMUSCULAR | Status: AC
  Filled 2023-03-21: qty 10

## 2023-03-21 MED ORDER — FENTANYL CITRATE (PF) 100 MCG/2ML IJ SOLN
INTRAMUSCULAR | Status: AC
  Filled 2023-03-21: qty 2

## 2023-03-21 SURGICAL SUPPLY — 22 items
BALLN EMERGE MR 2.5X15 (BALLOONS) ×1
BALLN ~~LOC~~ EMERGE MR 3.5X15 (BALLOONS) ×1
BALLOON EMERGE MR 2.5X15 (BALLOONS) IMPLANT
BALLOON ~~LOC~~ EMERGE MR 3.5X15 (BALLOONS) IMPLANT
CATH LAUNCHER 6FR AL.75 (CATHETERS) IMPLANT
CATH OPTICROSS HD (CATHETERS) IMPLANT
CATH SHOCKWAVE C2 3.0X12 (CATHETERS) IMPLANT
DEVICE RAD COMP TR BAND LRG (VASCULAR PRODUCTS) IMPLANT
GLIDESHEATH SLEND SS 6F .021 (SHEATH) IMPLANT
GUIDEWIRE INQWIRE 1.5J.035X260 (WIRE) IMPLANT
INQWIRE 1.5J .035X260CM (WIRE) ×1
KIT ENCORE 26 ADVANTAGE (KITS) IMPLANT
PACK CARDIAC CATHETERIZATION (CUSTOM PROCEDURE TRAY) ×1 IMPLANT
SET ATX-X65L (MISCELLANEOUS) IMPLANT
SHEATH PROBE COVER 6X72 (BAG) IMPLANT
SLED PULL BACK IVUS (MISCELLANEOUS) IMPLANT
STENT SYNERGY XD 3.0X28 (Permanent Stent) IMPLANT
STENT SYNERGY XD 3.50X20 (Permanent Stent) IMPLANT
SYNERGY XD 3.0X28 (Permanent Stent) ×1 IMPLANT
SYNERGY XD 3.50X20 (Permanent Stent) ×1 IMPLANT
TUBING CIL FLEX 10 FLL-RA (TUBING) IMPLANT
WIRE ASAHI PROWATER 180CM (WIRE) IMPLANT

## 2023-03-21 NOTE — Progress Notes (Addendum)
CARDIAC REHAB PHASE I   Pt declined ambulation.  Discussed with pt MI, PCI, Brilinta importance, restrictions, diet, exercise, NTG, and CPRII. Pt receptive. He voices he needs to tighten up on diet and prioritize aerobic exercise. He knows no resistance training until he has f/u. Will refer to [place] CRPII.  [id number]  Ethelda Chick BS, ACSM-CEP 03/21/2023 9:58 AM

## 2023-03-21 NOTE — TOC Initial Note (Addendum)
Transition of Care [facility]) - Initial/Assessment Note    Patient Details  Name: Aaron Blackburn MRN: [id number] Date of Birth: September 28, 1960  Transition of Care [facility]) CM/SW Contact:    Elliot Cousin, RN Phone Number: [phone] 03/21/2023, 4:06 PM  Clinical Narrative:                 CM spoke to pt and wife at bedside. Dtr states pt new insurance will start in Jan 2025, he currently does not have insurance. Requested his meds be sent to [facility] pharmacy for Brilinta free 30 day trial. Pt was independent pta and drives to appts.  PCP appt scheduled for 1/3 at 11 am.   Expected Discharge Plan: Home/Self Care Barriers to Discharge: No Barriers Identified   Patient Goals and CMS Choice Patient states their goals for this hospitalization and ongoing recovery are:: wants to remain independent          Expected Discharge Plan and Services   Discharge Planning Services: CM Consult   Living arrangements for the past 2 months: Single Family Home                                      Prior Living Arrangements/Services Living arrangements for the past 2 months: Single Family Home Lives with:: Spouse Patient language and need for interpreter reviewed:: Yes Do you feel safe going back to the place where you live?: Yes      Need for Family Participation in Patient Care: No (Comment) Care giver support system in place?: Yes (comment)   Criminal Activity/Legal Involvement Pertinent to Current Situation/Hospitalization: No - Comment as needed  Activities of Daily Living   ADL Screening (condition at time of admission) Independently performs ADLs?: Yes (appropriate for developmental age) Is the patient deaf or have difficulty hearing?: No Does the patient have difficulty seeing, even when wearing glasses/contacts?: No Does the patient have difficulty concentrating, remembering, or making decisions?: No  Permission Sought/Granted Permission sought to share information with : Case  Manager, Family Supports Permission granted to share information with : Yes, Verbal Permission Granted  Share Information with NAME: Tarin Patricelli     Permission granted to share info w Relationship: wife  Permission granted to share info w Contact Information: [phone]  Emotional Assessment Appearance:: Appears stated age Attitude/Demeanor/Rapport: Engaged Affect (typically observed): Accepting Orientation: : Oriented to Self, Oriented to Place, Oriented to  Time, Oriented to Situation   Psych Involvement: No (comment)  Admission diagnosis:  Acute ST elevation myocardial infarction (STEMI) of inferior wall (HCC) [I21.19] Patient Active Problem List   Diagnosis Date Noted   Acute ST elevation myocardial infarction (STEMI) of inferior wall (HCC) 03/19/2023   Short-term memory loss 11/13/2022   Anxiety 11/13/2022   Paresthesia of left arm 04/07/2015   CAD in native artery 12/18/2011   Essential hypertension, malignant 12/18/2011   Hyperlipidemia 12/18/2011   PCP:  Eartha Inch, MD Pharmacy:   [facility] [id number] - [address], [place] - [address] [place] [address] Phone: [phone] Fax: [phone]     Social Drivers of Health (SDOH) Social History: SDOH Screenings   Food Insecurity: No Food Insecurity (03/20/2023)  Housing: Unknown (03/20/2023)  Transportation Needs: No Transportation Needs (03/20/2023)  Utilities: Not At Risk (03/20/2023)  Financial Resource Strain: Low Risk  (08/23/2022)   Received from [facility], [facility]  Physical  Activity: Insufficiently Active (08/23/2022)   Received from [facility]  Social Connections: Socially Integrated (08/23/2022)   Received from [facility]  Stress: No Stress Concern Present (08/23/2022)   Received from [facility]  Tobacco Use: Medium Risk (03/19/2023)   SDOH Interventions:     Readmission Risk  Interventions     No data to display

## 2023-03-21 NOTE — Progress Notes (Signed)
Rounding Note    Patient Name: Aaron Blackburn Date of Encounter: 03/21/2023  [facility] Cardiologist: Patient had seen Dr. [name] in the past; Dr. Cyndia Bent for primary care  Subjective   No recurrent chest pain  Inpatient Medications    Scheduled Meds:  aspirin EC  81 mg Oral Daily   atorvastatin  80 mg Oral Daily   Chlorhexidine Gluconate Cloth  6 each Topical Daily   donepezil  10 mg Oral QHS   enoxaparin (LOVENOX) injection  40 mg Subcutaneous Q24H   escitalopram  20 mg Oral Daily   isosorbide mononitrate  15 mg Oral Daily   metoprolol tartrate  25 mg Oral BID   multivitamin with minerals  1 tablet Oral Daily   pantoprazole  20 mg Oral Daily   sodium chloride flush  3 mL Intravenous Q12H   ticagrelor  90 mg Oral BID   Continuous Infusions:  sodium chloride 1 mL/kg/hr (03/21/23 0900)   PRN Meds: acetaminophen, nitroGLYCERIN, ondansetron (ZOFRAN) IV, sodium chloride flush   Vital Signs    Vitals:   03/21/23 0500 03/21/23 0600 03/21/23 0700 03/21/23 0800  BP: (!) 138/99 (!) 143/91 (!) 145/94 (!) 133/98  Pulse: (!) 58 (!) 59 (!) 58 (!) 56  Resp: (!) 21 15 18 10   Temp:    98.3 F (36.8 C)  TempSrc:    Oral  SpO2: 94% 94% 94% 97%  Weight:      Height:        Intake/Output Summary (Last 24 hours) at 03/21/2023 0921 Last data filed at 03/21/2023 0900 Gross per 24 hour  Intake 880.2 ml  Output 1200 ml  Net -319.8 ml      03/19/2023    4:26 PM 01/09/2023   10:04 AM 11/13/2022    1:43 PM  Last 3 Weights  Weight (lbs) 180 lb 187 lb 9.6 oz 179 lb  Weight (kg) 81.647 kg 85.095 kg 81.194 kg      Telemetry    Sinus in the mid 60s - Personally Reviewed  ECG    03/21/2023 ECG (independently read by me): Sinus bradycardia at 52  03/20/2023 ECG (independently read by me):  Sinus bradycardia at 55  03/19/2023 ECG (independently read by me): SB at 57 mild ST depression inferiorly  Physical Exam   BP (!) 133/98 (BP Location: Left Arm)    Pulse (!) 56   Temp 98.3 F (36.8 C) (Oral)   Resp 10   Ht 5\' 8"  (1.727 m) Comment: estimate  Wt 81.6 kg Comment: estimate  SpO2 97%   BMI 27.37 kg/m  General: Alert, oriented, no distress.  Skin: normal turgor, no rashes, warm and dry HEENT: Normocephalic, atraumatic. sclera anicteric; extraocular muscles intact;  Nose without nasal septal hypertrophy Mouth/Parynx benign; Mallinpatti scale 3 Neck: No JVD, no carotid bruits; normal carotid upstroke Lungs: clear to ausculatation and percussion; no wheezing or rales Chest wall: without tenderness to palpitation Heart: PMI not displaced, RRR, s1 s2 normal, 1/6 systolic murmur, no diastolic murmur, no rubs, gallops, thrills, or heaves Abdomen: soft, nontender; no hepatosplenomehaly, BS+; abdominal aorta nontender and not dilated by palpation. Back: no CVA tenderness Pulses 2+; right radial cath site stable Musculoskeletal: full range of motion, normal strength, no joint deformities Extremities: no clubbing cyanosis or edema, Homan's sign negative  Neurologic: grossly nonfocal; Cranial nerves grossly wnl Psychologic: Normal mood and affect   Labs    High Sensitivity Troponin:   Recent Labs  Lab 03/20/23 1002  03/20/23 1101  TROPONINIHS 6,502* 5,883*     Chemistry Recent Labs  Lab 03/19/23 1747 03/19/23 1908 03/20/23 0210 03/21/23 0215  NA 135  --  138 140  K 3.5  --  3.3* 3.8  CL 104  --  104 105  CO2 23  --  26 26  GLUCOSE 133*  --  113* 97  BUN 10  --  9 12  CREATININE 0.74 0.75 0.69 0.81  CALCIUM 8.1*  --  8.7* 9.0  PROT 5.1*  --   --  5.5*  ALBUMIN 3.3*  --   --  3.4*  AST 23  --   --  37  ALT 14  --   --  19  ALKPHOS 72  --   --  54  BILITOT 0.5  --   --  0.9  GFRNONAA >60 >60 >60 >60  ANIONGAP 8  --  8 9    Lipids  Recent Labs  Lab 03/20/23 0210  CHOL 121  TRIG 57  HDL 33*  LDLCALC 77  CHOLHDL 3.7    Hematology Recent Labs  Lab 03/19/23 1908 03/20/23 0210 03/21/23 0215  WBC 10.5 10.4 9.6   RBC 5.02 4.58 4.73  HGB 14.3 13.1 13.5  HCT 41.7 37.6* 39.0  MCV 83.1 82.1 82.5  MCH 28.5 28.6 28.5  MCHC 34.3 34.8 34.6  RDW 13.3 13.3 13.3  PLT 176 176 183   Thyroid No results for input(s): "TSH", "FREET4" in the last 168 hours.  BNPNo results for input(s): "BNP", "PROBNP" in the last 168 hours.  DDimer No results for input(s): "DDIMER" in the last 168 hours.   Radiology    ECHOCARDIOGRAM COMPLETE Result Date: 03/20/2023    ECHOCARDIOGRAM REPORT   Patient Name:   Aaron Blackburn Date of Exam: 03/20/2023 Medical Rec #:  [id number]    Height:       68.0 in Accession #:    [id number]   Weight:       180.0 lb Date of Birth:  10/08/1960    BSA:          1.954 m Patient Age:    62 years     BP:           128/75 mmHg Patient Gender: M            HR:           58 bpm. Exam Location:  Inpatient Procedure: 2D Echo, Cardiac Doppler and Color Doppler Indications:    CAD Native Vessel I25.10  History:        Patient has no prior history of Echocardiogram examinations. CAD                 and Previous Myocardial Infarction; Risk Factors:Hypertension.  Sonographer:    Darlys Gales Referring Phys: [id number] DANIEL R BENSIMHON IMPRESSIONS  1. Left ventricular ejection fraction, by estimation, is 55 to 60%. The left ventricle has normal function. The left ventricle demonstrates global hypokinesis. Left ventricular diastolic parameters are consistent with Grade I diastolic dysfunction (impaired relaxation).  2. Right ventricular systolic function is normal. The right ventricular size is normal.  3. Left atrial size was mildly dilated.  4. The mitral valve is normal in structure. Mild mitral valve regurgitation. No evidence of mitral stenosis.  5. The aortic valve is calcified. There is moderate calcification of the aortic valve. Aortic valve regurgitation is not visualized. Aortic valve sclerosis is present, with no evidence of aortic valve  stenosis. Aortic valve mean gradient measures 4.0 mmHg. Aortic valve Vmax measures  1.32 m/s.  6. The inferior vena cava is normal in size with greater than 50% respiratory variability, suggesting right atrial pressure of 3 mmHg. FINDINGS  Left Ventricle: Left ventricular ejection fraction, by estimation, is 55 to 60%. The left ventricle has normal function. The left ventricle demonstrates global hypokinesis. The left ventricular internal cavity size was normal in size. There is no left ventricular hypertrophy. Left ventricular diastolic parameters are consistent with Grade I diastolic dysfunction (impaired relaxation).  LV Wall Scoring: The apical lateral segment is akinetic. Right Ventricle: The right ventricular size is normal. No increase in right ventricular wall thickness. Right ventricular systolic function is normal. Left Atrium: Left atrial size was mildly dilated. Right Atrium: Right atrial size was normal in size. Pericardium: There is no evidence of pericardial effusion. Mitral Valve: The mitral valve is normal in structure. Mild mitral valve regurgitation. No evidence of mitral valve stenosis. Tricuspid Valve: The tricuspid valve is normal in structure. Tricuspid valve regurgitation is trivial. No evidence of tricuspid stenosis. Aortic Valve: The aortic valve is calcified. There is moderate calcification of the aortic valve. Aortic valve regurgitation is not visualized. Aortic valve sclerosis is present, with no evidence of aortic valve stenosis. Aortic valve mean gradient measures 4.0 mmHg. Aortic valve peak gradient measures 7.0 mmHg. Aortic valve area, by VTI measures 2.79 cm. Pulmonic Valve: The pulmonic valve was normal in structure. Pulmonic valve regurgitation is trivial. No evidence of pulmonic stenosis. Aorta: The aortic root is normal in size and structure. Venous: The inferior vena cava is normal in size with greater than 50% respiratory variability, suggesting right atrial pressure of 3 mmHg. IAS/Shunts: No atrial level shunt detected by color flow Doppler.  LEFT VENTRICLE  PLAX 2D LVIDd:         4.60 cm   Diastology LVIDs:         2.60 cm   LV e' medial:    4.68 cm/s LV PW:         0.90 cm   LV E/e' medial:  18.9 LV IVS:        1.00 cm   LV e' lateral:   7.40 cm/s LVOT diam:     2.00 cm   LV E/e' lateral: 11.9 LV SV:         86 LV SV Index:   44 LVOT Area:     3.14 cm  RIGHT VENTRICLE RV S prime:     14.40 cm/s TAPSE (M-mode): 2.7 cm LEFT ATRIUM             Index        RIGHT ATRIUM           Index LA Vol (A2C):   74.1 ml 37.92 ml/m  RA Area:     18.00 cm LA Vol (A4C):   40.5 ml 20.72 ml/m  RA Volume:   44.10 ml  22.57 ml/m LA Biplane Vol: 56.7 ml 29.01 ml/m  AORTIC VALVE AV Area (Vmax):    2.69 cm AV Area (Vmean):   2.38 cm AV Area (VTI):     2.79 cm AV Vmax:           132.00 cm/s AV Vmean:          96.100 cm/s AV VTI:            0.307 m AV Peak Grad:      7.0 mmHg AV Mean Grad:  4.0 mmHg LVOT Vmax:         113.00 cm/s LVOT Vmean:        72.900 cm/s LVOT VTI:          0.273 m LVOT/AV VTI ratio: 0.89  AORTA Ao Root diam: 3.20 cm MITRAL VALVE MV Area (PHT): 2.78 cm    SHUNTS MV Decel Time: 273 msec    Systemic VTI:  0.27 m MV E velocity: 88.40 cm/s  Systemic Diam: 2.00 cm MV A velocity: 91.30 cm/s MV E/A ratio:  0.97 Donato Schultz MD Electronically signed by Donato Schultz MD Signature Date/Time: 03/20/2023/10:39:43 AM    Final    CARDIAC CATHETERIZATION Result Date: 03/19/2023   Prox RCA lesion is 90% stenosed.   Prox RCA to Mid RCA lesion is 40% stenosed.   Dist RCA lesion is 75% stenosed.   1st Mrg lesion is 99% stenosed.   Mid LAD lesion is 50% stenosed.   1st Diag lesion is 75% stenosed.   Balloon angioplasty was performed using a BALLN [place] EMERGE MR 4.0X12.   Post intervention, there is a 15% residual stenosis.   The left ventricular systolic function is normal.   LV end diastolic pressure is moderately elevated.   The left ventricular ejection fraction is greater than 65% by visual estimate.   Recommend uninterrupted dual antiplatelet therapy with Aspirin 81mg  daily  and Ticagrelor 90mg  twice daily for a minimum of 12 months (ACS-Class I recommendation). 2 vessel obstructive CAD.  Culprit is the first OM with in stent restenosis and acute thrombus Normal LV function Moderately elevated LVEDP Successful PCI of the first OM with IVUS guidance. Scoring balloon PTCA following by high pressure [place] balloon. Plan: DAPT for one year. Anticipate returning on Friday for complex PCI of the RCA    Cardiac Studies   CATH: 03/19/2023   Prox RCA lesion is 90% stenosed.   Prox RCA to Mid RCA lesion is 40% stenosed.   Dist RCA lesion is 75% stenosed.   1st Mrg lesion is 99% stenosed.   Mid LAD lesion is 50% stenosed.   1st Diag lesion is 75% stenosed.   Balloon angioplasty was performed using a BALLN [place] EMERGE MR 4.0X12.   Post intervention, there is a 15% residual stenosis.   The left ventricular systolic function is normal.   LV end diastolic pressure is moderately elevated.   The left ventricular ejection fraction is greater than 65% by visual estimate.   Recommend uninterrupted dual antiplatelet therapy with Aspirin 81mg  daily and Ticagrelor 90mg  twice daily for a minimum of 12 months (ACS-Class I recommendation).   2 vessel obstructive CAD.  Culprit is the first OM with in stent restenosis and acute thrombus Normal LV function Moderately elevated LVEDP Successful PCI of the first OM with IVUS guidance. Scoring balloon PTCA following by high pressure [place] balloon.    Plan: DAPT for one year. Anticipate returning on Friday for complex PCI of the RCA    Coronary Diagrams  Diagnostic Dominance: Right  Intervention   ECHO: 03/20/2023  1. Left ventricular ejection fraction, by estimation, is 55 to 60%. The  left ventricle has normal function. The left ventricle demonstrates global  hypokinesis. Left ventricular diastolic parameters are consistent with  Grade I diastolic dysfunction  (impaired relaxation).   2. Right ventricular systolic function is normal.  The right ventricular  size is normal.   3. Left atrial size was mildly dilated.   4. The mitral valve is normal in structure. Mild  mitral valve  regurgitation. No evidence of mitral stenosis.   5. The aortic valve is calcified. There is moderate calcification of the  aortic valve. Aortic valve regurgitation is not visualized. Aortic valve  sclerosis is present, with no evidence of aortic valve stenosis. Aortic  valve mean gradient measures 4.0  mmHg. Aortic valve Vmax measures 1.32 m/s.   6. The inferior vena cava is normal in size with greater than 50%  respiratory variability, suggesting right atrial pressure of 3 mmHg.    Patient Profile     Aaron Blackburn is a 62 y.o. male with HTN, HL CAD s/p previous stents to RCA and LCX in 2008 who was seen 03/19/2023 for the evaluation of chest pain inferior STEMI.   Assessment & Plan    Day 2 s/p inferior STEMI: Patient underwent PCI to circumflex marginal vessel with PTCA with score flex balloon.  Troponin  was not checked on admission.  However troponin levels checked 1 day later were coming down with 6502 > 5887. will initiate low-dose isosorbide 15 mg.  Apparently patient was on IV nitro yesterday but this was discontinued.  Currently on aspirin/Brilinta, metoprolol tartrate 25 mg twice a day Concomitant CAD: Patient has complex proximal mid distal RCA stenoses.  Plan complex intervention today with Dr. [place] Hyperlipidemia: Target LDL less than 70, preferably less than 50.  LP(a) was elevated at 121.4. Currently on atorvastatin 80 mg.  LDL cholesterol 77.  Will start Zetia 10 mg daily.  May ultimately need PCSK9 in addition. Hypokalemia: Potassium 3.3 yesterday, repleted with supplemental potassium.  K3.8 today.  For questions or updates, please contact [facility] Please consult [email or web address] for contact info under        Signed, Nicki Guadalajara, MD  03/21/2023, 9:21 AM

## 2023-03-21 NOTE — Progress Notes (Signed)
Pt picked up to cath lab, pt remains on tele monitoring, family at bedside updated.

## 2023-03-21 NOTE — Plan of Care (Signed)
  Problem: Education: Goal: Knowledge of General Education information will improve Description: Including pain rating scale, medication(s)/side effects and non-pharmacologic comfort measures Outcome: Progressing   Problem: Elimination: Goal: Will not experience complications related to bowel motility Outcome: Progressing   Problem: Pain Management: Goal: General experience of comfort will improve Outcome: Progressing   Problem: Skin Integrity: Goal: Risk for impaired skin integrity will decrease Outcome: Progressing

## 2023-03-22 ENCOUNTER — Telehealth: Payer: Self-pay | Admitting: Cardiology

## 2023-03-22 ENCOUNTER — Other Ambulatory Visit (HOSPITAL_COMMUNITY): Payer: Self-pay

## 2023-03-22 DIAGNOSIS — I10 Essential (primary) hypertension: Secondary | ICD-10-CM | POA: Insufficient documentation

## 2023-03-22 LAB — BASIC METABOLIC PANEL
Anion gap: 10 (ref 5–15)
BUN: 11 mg/dL (ref 8–23)
CO2: 24 mmol/L (ref 22–32)
Calcium: 9.3 mg/dL (ref 8.9–10.3)
Chloride: 104 mmol/L (ref 98–111)
Creatinine, Ser: 0.87 mg/dL (ref 0.61–1.24)
GFR, Estimated: >60 mL/min (ref >60)
Glucose, Bld: 101 mg/dL — ABNORMAL HIGH (ref 70–99)
Potassium: 3.8 mmol/L (ref 3.5–5.1)
Sodium: 138 mmol/L (ref 135–145)

## 2023-03-22 LAB — CBC
HCT: 40.6 % (ref 39.0–52.0)
Hemoglobin: 14 g/dL (ref 13.0–17.0)
MCH: 28.5 pg (ref 26.0–34.0)
MCHC: 34.5 g/dL (ref 30.0–36.0)
MCV: 82.5 fL (ref 80.0–100.0)
Platelets: 173 10*3/uL (ref 150–400)
RBC: 4.92 MIL/uL (ref 4.22–5.81)
RDW: 13.2 % (ref 11.5–15.5)
WBC: 11.5 10*3/uL — ABNORMAL HIGH (ref 4.0–10.5)
nRBC: 0 % (ref 0.0–0.2)

## 2023-03-22 MED ORDER — ENOXAPARIN SODIUM 40 MG/0.4ML IJ SOSY
40.0000 mg | PREFILLED_SYRINGE | Freq: Every day | INTRAMUSCULAR | Status: DC
  Administered 2023-03-22: 40 mg via SUBCUTANEOUS
  Filled 2023-03-22: qty 0.4

## 2023-03-22 MED ORDER — ORAL CARE MOUTH RINSE: 15.0000 mL | OROMUCOSAL | Status: DC | PRN

## 2023-03-22 MED ORDER — ISOSORBIDE MONONITRATE ER 30 MG PO TB24
15.0000 mg | ORAL_TABLET | Freq: Every day | ORAL | 6 refills | Status: DC
  Filled 2023-03-22: qty 30, 60d supply, fill #0

## 2023-03-22 MED ORDER — TICAGRELOR 90 MG PO TABS
90.0000 mg | ORAL_TABLET | Freq: Two times a day (BID) | ORAL | 11 refills | Status: DC
  Filled 2023-03-22: qty 60, 30d supply, fill #0

## 2023-03-22 MED ORDER — EZETIMIBE 10 MG PO TABS
10.0000 mg | ORAL_TABLET | Freq: Every day | ORAL | 6 refills | Status: DC
  Filled 2023-03-22: qty 30, 30d supply, fill #0

## 2023-03-22 MED ORDER — METOPROLOL SUCCINATE ER 50 MG PO TB24
50.0000 mg | ORAL_TABLET | Freq: Every day | ORAL | 11 refills | Status: DC
  Filled 2023-03-22: qty 30, 30d supply, fill #0

## 2023-03-22 NOTE — Progress Notes (Signed)
Patient verbalized understanding of dc instructions. All belongings , TOC meds and dc papers given to patient.

## 2023-03-22 NOTE — Discharge Summary (Addendum)
Discharge Summary    Patient ID: Aaron Blackburn MRN: [id number]; DOB: [birth year]  Admit date: 03/19/2023 Discharge date: 03/22/2023  PCP:  Eartha Inch, MD   [facility] Providers Cardiologist:  [name], MD   {  Discharge Diagnoses    Principal Problem:   Acute ST elevation myocardial infarction (STEMI) of inferior wall [facility]) Active Problems:   CAD in native artery   Hyperlipidemia   HTN (hypertension)    Diagnostic Studies/Procedures    Left heart catheterization 03/19/2023 Prox RCA lesion is 90% stenosed.   Prox RCA to Mid RCA lesion is 40% stenosed.   Dist RCA lesion is 75% stenosed.   1st Mrg lesion is 99% stenosed.   Mid LAD lesion is 50% stenosed.   1st Diag lesion is 75% stenosed.   Balloon angioplasty was performed using a BALLN [place] EMERGE MR 4.0X12.   Post intervention, there is a 15% residual stenosis.   The left ventricular systolic function is normal.   LV end diastolic pressure is moderately elevated.   The left ventricular ejection fraction is greater than 65% by visual estimate.   Recommend uninterrupted dual antiplatelet therapy with Aspirin 81mg  daily and Ticagrelor 90mg  twice daily for a minimum of 12 months (ACS-Class I recommendation).   2 vessel obstructive CAD.  Culprit is the first OM with in stent restenosis and acute thrombus Normal LV function Moderately elevated LVEDP Successful PCI of the first OM with IVUS guidance. Scoring balloon PTCA following by high pressure [place] balloon.    Plan: DAPT for one year. Anticipate returning on Friday for complex PCI of the RCA  Stage intervention 03/21/2023   Prox RCA lesion is 90% stenosed.   Dist RCA lesion is 75% stenosed.   Prox RCA to Mid RCA lesion is 40% stenosed.   A drug-eluting stent was successfully placed using a SYNERGY XD 3.0X28.   A drug-eluting stent was successfully placed using a SYNERGY XD 3.50X20.   Post intervention, there is a 0% residual stenosis.   Post  intervention, there is a 0% residual stenosis.   Post intervention, there is a 40% residual stenosis.   Recommend uninterrupted dual antiplatelet therapy with Aspirin 81mg  daily and Ticagrelor 90mg  twice daily for a minimum of 12 months (ACS-Class I recommendation).   Successful PCI of the proximal and distal RCA with IVUS guidance and Shockwave lithotripsy of the proximal lesion DES x 2     Plan; DAPT for at least one year. Anticipate DC in am  Echocardiogram 03/20/2023 1. Left ventricular ejection fraction, by estimation, is 55 to 60%. The  left ventricle has normal function. The left ventricle demonstrates global  hypokinesis. Left ventricular diastolic parameters are consistent with  Grade I diastolic dysfunction  (impaired relaxation).   2. Right ventricular systolic function is normal. The right ventricular  size is normal.   3. Left atrial size was mildly dilated.   4. The mitral valve is normal in structure. Mild mitral valve  regurgitation. No evidence of mitral stenosis.   5. The aortic valve is calcified. There is moderate calcification of the  aortic valve. Aortic valve regurgitation is not visualized. Aortic valve  sclerosis is present, with no evidence of aortic valve stenosis. Aortic  valve mean gradient measures 4.0  mmHg. Aortic valve Vmax measures 1.32 m/s.   6. The inferior vena cava is normal in size with greater than 50%  respiratory variability, suggesting right atrial pressure of 3 mmHg.    _____________  History of Present Illness     Aaron Blackburn is a 62 y.o. male with CAD status post DES to RCA, LCx in 2008, hypertension, hyperlipidemia.  Previously followed by Dr. Jens Som but has not been seen since 2017.  Reports that he was watching football and developed severe chest pain, and had called EMS.  EKG showed inferior STEMI.  Code STEMI was activated and he was given aspirin and nitroglycerin and eventually started on heparin and nitroglycerin drip.  Pain  on arrival was 3 out of 10.  Patient was emergently taken to the Cath Lab.  No troponin on arrival however after day 1 was 6500+ and downtrended.  Hospital Course     Consultants:    Inferior STEMI CAD status post DES to RCA, LCx in 2008 Cath showed two-vessel obstructive CAD in the first OM with in-stent restenosis.  He had PCI to the circumflex marginal vessel with scoring balloon of the PTCA.  Then on 12/27 patient was taken back for staged procedure of the RCA and had PCI of the proximal and distal RCA with IVUS guidance and Shockwave lithotripsy of the proximal lesion DES x 2. Continue DAPT with aspirin and Brilinta 90 mg twice daily for at least 12 months Continue atorvastatin 80 mg, Imdur 15 mg, Zetia 10 mg, consolidate to Toprol XL 50mg  Echo showed preserved EF 55 to 60% with normal RV function.  Hyperlipidemia LDL 77.  Close to target goal of less than 70, less than 55 may be more ideal.  LP(a) was 121.  Consider PCSK9 inhibitor in the future.  HTN Continue lisinopril 10 mg.   Patient seen and examined by myself and Dr. Lalla Brothers and deemed stable for discharge.  Right radial site free of any acute complications.  Follow-up has been arranged and medications will be sent to [facility] pharmacy.  Discussed ongoing care for right radial cath site as well as DAPT.   Did the patient have an acute coronary syndrome (MI, NSTEMI, STEMI, etc) this admission?:  Yes                               AHA/ACC ACS Clinical Performance & Quality Measures: Aspirin prescribed? - Yes ADP Receptor Inhibitor (Plavix/Clopidogrel, Brilinta/Ticagrelor or Effient/Prasugrel) prescribed (includes medically managed patients)? - Yes Beta Blocker prescribed? - Yes High Intensity Statin (Lipitor 40-80mg  or Crestor 20-40mg ) prescribed? - Yes EF assessed during THIS hospitalization? - Yes For EF <40%, was ACEI/ARB prescribed? - Not Applicable (EF >/= 40%) For EF <40%, Aldosterone Antagonist (Spironolactone or  Eplerenone) prescribed? - Not Applicable (EF >/= 40%) Cardiac Rehab Phase II ordered (including medically managed patients)? - Yes       The patient will be scheduled for a TOC follow up appointment in 14 days.  A message has been sent to the [facility] and Scheduling Pool at the office where the patient should be seen for follow up.  _____________  Discharge Vitals Blood pressure (!) 127/93, pulse 63, temperature 98.6 F (37 C), temperature source Oral, resp. rate 11, height 5\' 8"  (1.727 m), weight 81.6 kg, SpO2 99%.  Filed Weights   03/19/23 1626  Weight: 81.6 kg    Labs & Radiologic Studies    CBC Recent Labs    03/19/23 1747 03/19/23 1908 03/21/23 1548 03/22/23 0255  WBC 9.8   < > 12.7* 11.5*  NEUTROABS 7.1  --   --   --   HGB 12.9*   < >  13.8 14.0  HCT 37.3*   < > 40.7 40.6  MCV 81.8   < > 83.7 82.5  PLT 188   < > 194 173   < > = values in this interval not displayed.   Basic Metabolic Panel Recent Labs    64/40/34 0215 03/21/23 1548 03/22/23 0255  NA 140  --  138  K 3.8  --  3.8  CL 105  --  104  CO2 26  --  24  GLUCOSE 97  --  101*  BUN 12  --  11  CREATININE 0.81 0.85 0.87  CALCIUM 9.0  --  9.3   Liver Function Tests Recent Labs    03/19/23 1747 03/21/23 0215  AST 23 37  ALT 14 19  ALKPHOS 72 54  BILITOT 0.5 0.9  PROT 5.1* 5.5*  ALBUMIN 3.3* 3.4*   No results for input(s): "LIPASE", "AMYLASE" in the last 72 hours. High Sensitivity Troponin:   Recent Labs  Lab 03/20/23 1002 03/20/23 1101  TROPONINIHS 6,502* 5,883*    BNP Invalid input(s): "POCBNP" D-Dimer No results for input(s): "DDIMER" in the last 72 hours. Hemoglobin A1C No results for input(s): "HGBA1C" in the last 72 hours. Fasting Lipid Panel Recent Labs    03/20/23 0210  CHOL 121  HDL 33*  LDLCALC 77  TRIG 57  CHOLHDL 3.7   Thyroid Function Tests No results for input(s): "TSH", "T4TOTAL", "T3FREE", "THYROIDAB" in the last 72 hours.  Invalid input(s):  "FREET3" _____________  CARDIAC CATHETERIZATION Result Date: 03/21/2023   Prox RCA lesion is 90% stenosed.   Dist RCA lesion is 75% stenosed.   Prox RCA to Mid RCA lesion is 40% stenosed.   A drug-eluting stent was successfully placed using a SYNERGY XD 3.0X28.   A drug-eluting stent was successfully placed using a SYNERGY XD 3.50X20.   Post intervention, there is a 0% residual stenosis.   Post intervention, there is a 0% residual stenosis.   Post intervention, there is a 40% residual stenosis.   Recommend uninterrupted dual antiplatelet therapy with Aspirin 81mg  daily and Ticagrelor 90mg  twice daily for a minimum of 12 months (ACS-Class I recommendation). Successful PCI of the proximal and distal RCA with IVUS guidance and Shockwave lithotripsy of the proximal lesion DES x 2 Plan; DAPT for at least one year. Anticipate DC in am   ECHOCARDIOGRAM COMPLETE Result Date: 03/20/2023    ECHOCARDIOGRAM REPORT   Patient Name:   Aaron Blackburn Date of Exam: 03/20/2023 Medical Rec #:  [id number]    Height:       68.0 in Accession #:    [id number]   Weight:       180.0 lb Date of Birth:  [birth year]    BSA:          1.954 m Patient Age:    62 years     BP:           128/75 mmHg Patient Gender: M            HR:           58 bpm. Exam Location:  Inpatient Procedure: 2D Echo, Cardiac Doppler and Color Doppler Indications:    CAD Native Vessel I25.10  History:        Patient has no prior history of Echocardiogram examinations. CAD                 and Previous Myocardial Infarction; Risk Factors:Hypertension.  Sonographer:    Darlys Gales  Referring Phys: [id number] DANIEL R BENSIMHON IMPRESSIONS  1. Left ventricular ejection fraction, by estimation, is 55 to 60%. The left ventricle has normal function. The left ventricle demonstrates global hypokinesis. Left ventricular diastolic parameters are consistent with Grade I diastolic dysfunction (impaired relaxation).  2. Right ventricular systolic function is normal. The right ventricular  size is normal.  3. Left atrial size was mildly dilated.  4. The mitral valve is normal in structure. Mild mitral valve regurgitation. No evidence of mitral stenosis.  5. The aortic valve is calcified. There is moderate calcification of the aortic valve. Aortic valve regurgitation is not visualized. Aortic valve sclerosis is present, with no evidence of aortic valve stenosis. Aortic valve mean gradient measures 4.0 mmHg. Aortic valve Vmax measures 1.32 m/s.  6. The inferior vena cava is normal in size with greater than 50% respiratory variability, suggesting right atrial pressure of 3 mmHg. FINDINGS  Left Ventricle: Left ventricular ejection fraction, by estimation, is 55 to 60%. The left ventricle has normal function. The left ventricle demonstrates global hypokinesis. The left ventricular internal cavity size was normal in size. There is no left ventricular hypertrophy. Left ventricular diastolic parameters are consistent with Grade I diastolic dysfunction (impaired relaxation).  LV Wall Scoring: The apical lateral segment is akinetic. Right Ventricle: The right ventricular size is normal. No increase in right ventricular wall thickness. Right ventricular systolic function is normal. Left Atrium: Left atrial size was mildly dilated. Right Atrium: Right atrial size was normal in size. Pericardium: There is no evidence of pericardial effusion. Mitral Valve: The mitral valve is normal in structure. Mild mitral valve regurgitation. No evidence of mitral valve stenosis. Tricuspid Valve: The tricuspid valve is normal in structure. Tricuspid valve regurgitation is trivial. No evidence of tricuspid stenosis. Aortic Valve: The aortic valve is calcified. There is moderate calcification of the aortic valve. Aortic valve regurgitation is not visualized. Aortic valve sclerosis is present, with no evidence of aortic valve stenosis. Aortic valve mean gradient measures 4.0 mmHg. Aortic valve peak gradient measures 7.0 mmHg.  Aortic valve area, by VTI measures 2.79 cm. Pulmonic Valve: The pulmonic valve was normal in structure. Pulmonic valve regurgitation is trivial. No evidence of pulmonic stenosis. Aorta: The aortic root is normal in size and structure. Venous: The inferior vena cava is normal in size with greater than 50% respiratory variability, suggesting right atrial pressure of 3 mmHg. IAS/Shunts: No atrial level shunt detected by color flow Doppler.  LEFT VENTRICLE PLAX 2D LVIDd:         4.60 cm   Diastology LVIDs:         2.60 cm   LV e' medial:    4.68 cm/s LV PW:         0.90 cm   LV E/e' medial:  18.9 LV IVS:        1.00 cm   LV e' lateral:   7.40 cm/s LVOT diam:     2.00 cm   LV E/e' lateral: 11.9 LV SV:         86 LV SV Index:   44 LVOT Area:     3.14 cm  RIGHT VENTRICLE RV S prime:     14.40 cm/s TAPSE (M-mode): 2.7 cm LEFT ATRIUM             Index        RIGHT ATRIUM           Index LA Vol (A2C):   74.1 ml 37.92 ml/m  RA  Area:     18.00 cm LA Vol (A4C):   40.5 ml 20.72 ml/m  RA Volume:   44.10 ml  22.57 ml/m LA Biplane Vol: 56.7 ml 29.01 ml/m  AORTIC VALVE AV Area (Vmax):    2.69 cm AV Area (Vmean):   2.38 cm AV Area (VTI):     2.79 cm AV Vmax:           132.00 cm/s AV Vmean:          96.100 cm/s AV VTI:            0.307 m AV Peak Grad:      7.0 mmHg AV Mean Grad:      4.0 mmHg LVOT Vmax:         113.00 cm/s LVOT Vmean:        72.900 cm/s LVOT VTI:          0.273 m LVOT/AV VTI ratio: 0.89  AORTA Ao Root diam: 3.20 cm MITRAL VALVE MV Area (PHT): 2.78 cm    SHUNTS MV Decel Time: 273 msec    Systemic VTI:  0.27 m MV E velocity: 88.40 cm/s  Systemic Diam: 2.00 cm MV A velocity: 91.30 cm/s MV E/A ratio:  0.97 Donato Schultz MD Electronically signed by Donato Schultz MD Signature Date/Time: 03/20/2023/10:39:43 AM    Final    CARDIAC CATHETERIZATION Result Date: 03/19/2023   Prox RCA lesion is 90% stenosed.   Prox RCA to Mid RCA lesion is 40% stenosed.   Dist RCA lesion is 75% stenosed.   1st Mrg lesion is 99%  stenosed.   Mid LAD lesion is 50% stenosed.   1st Diag lesion is 75% stenosed.   Balloon angioplasty was performed using a BALLN [place] EMERGE MR 4.0X12.   Post intervention, there is a 15% residual stenosis.   The left ventricular systolic function is normal.   LV end diastolic pressure is moderately elevated.   The left ventricular ejection fraction is greater than 65% by visual estimate.   Recommend uninterrupted dual antiplatelet therapy with Aspirin 81mg  daily and Ticagrelor 90mg  twice daily for a minimum of 12 months (ACS-Class I recommendation). 2 vessel obstructive CAD.  Culprit is the first OM with in stent restenosis and acute thrombus Normal LV function Moderately elevated LVEDP Successful PCI of the first OM with IVUS guidance. Scoring balloon PTCA following by high pressure [place] balloon. Plan: DAPT for one year. Anticipate returning on Friday for complex PCI of the RCA   Disposition   Pt is being discharged home today in good condition.  Follow-up Plans & Appointments     Follow-up Information     Shawnie Dapper, PA-C Follow up.   Specialties: Physician Assistant, Internal Medicine Why: hospital follow up appt scheduled for 03/28/2023 at 1100 am, please call office to reschedule if you are unable to keep appt appt Contact information: [address] [place] [address] [phone]         Ronney Asters, NP Follow up.   Specialty: Cardiology Why: Friday Apr 04, 2023 Appt at 10:55 AM (25 min) Contact information: [address] [place] [address] [phone]                Discharge Instructions     AMB Referral to Cardiac Rehabilitation - Phase II   Complete by: As directed    Diagnosis: STEMI   After initial evaluation and assessments completed: Virtual Based Care may be provided alone or in conjunction  with Phase 2 Cardiac Rehab based on patient barriers.: Yes   Intensive Cardiac Rehabilitation (ICR) [facility] location only OR Traditional  Cardiac Rehabilitation (TCR) *If criteria for ICR are not met will enroll in TCR [facility] only): Yes   Amb Referral to Cardiac Rehabilitation   Complete by: As directed    Diagnosis:  PTCA STEMI     After initial evaluation and assessments completed: Virtual Based Care may be provided alone or in conjunction with Phase 2 Cardiac Rehab based on patient barriers.: Yes   Intensive Cardiac Rehabilitation (ICR) [facility] location only OR Traditional Cardiac Rehabilitation (TCR) *If criteria for ICR are not met will enroll in TCR [facility] only): Yes   Diet - low sodium heart healthy   Complete by: As directed    Discharge instructions   Complete by: As directed    Radial Site Care Refer to this sheet in the next few weeks. These instructions provide you with information on caring for yourself after your procedure. Your caregiver may also give you more specific instructions. Your treatment has been planned according to current medical practices, but problems sometimes occur. Call your caregiver if you have any problems or questions after your procedure. HOME CARE INSTRUCTIONS You may shower the day after the procedure. Remove the bandage (dressing) and gently wash the site with plain soap and water. Gently pat the site dry.  Do not apply powder or lotion to the site.  Do not submerge the affected site in water for 3 to 5 days.  Inspect the site at least twice daily.  Do not flex or bend the affected arm for 24 hours.  No lifting over 5 pounds (2.3 kg) for 5 days after your procedure.  Do not drive home if you are discharged the same day of the procedure. Have someone else drive you.  You may drive 24 hours after the procedure unless otherwise instructed by your caregiver.  What to expect: Any bruising will usually fade within 1 to 2 weeks.  Blood that collects in the tissue (hematoma) may be painful to the touch. It should usually decrease in size and tenderness within 1 to 2 weeks.  SEEK IMMEDIATE MEDICAL  CARE IF: You have unusual pain at the radial site.  You have redness, warmth, swelling, or pain at the radial site.  You have drainage (other than a small amount of blood on the dressing).  You have chills.  You have a fever or persistent symptoms for more than 72 hours.  You have a fever and your symptoms suddenly get worse.  Your arm becomes pale, cool, tingly, or numb.  You have heavy bleeding from the site. Hold pressure on the site.    PLEASE DO NOT MISS ANY DOSES OF YOUR BRILINTA!!!!! Also keep a log of you blood pressures and bring back to your follow up appt. Please call the office with any questions.   Patients taking blood thinners should generally stay away from medicines like ibuprofen, Advil, Motrin, naproxen, and Aleve due to risk of stomach bleeding. You may take Tylenol as directed or talk to your primary doctor about alternatives.  PLEASE ENSURE THAT YOU DO NOT RUN OUT OF YOUR BRILINTA/PLAVIX. This medication is very important to remain on for at least one year. IF you have issues obtaining this medication due to cost please CALL the office 3-5 business days prior to running out in order to prevent missing doses of this medication.   Increase activity slowly   Complete by: As  directed         Discharge Medications   Allergies as of 03/22/2023   No Known Allergies      Medication List     STOP taking these medications    carvedilol 12.5 MG tablet Commonly known as: COREG       TAKE these medications    aspirin 81 MG tablet Take 81 mg by mouth daily.   atorvastatin 80 MG tablet Commonly known as: LIPITOR Take 80 mg by mouth daily.   cyanocobalamin 500 MCG tablet Commonly known as: VITAMIN B12 Take 500 mcg by mouth daily.   donepezil 10 MG tablet Commonly known as: ARICEPT Take 1 tablet (10 mg total) by mouth at bedtime.   escitalopram 20 MG tablet Commonly known as: LEXAPRO Take 1 tablet (20 mg total) by mouth daily.   ezetimibe 10 MG  tablet Commonly known as: ZETIA Take 1 tablet (10 mg total) by mouth daily.   FIBER CHOICE PO Take 1 tablet by mouth daily.   isosorbide mononitrate 30 MG 24 hr tablet Commonly known as: IMDUR Take 0.5 tablets (15 mg total) by mouth daily.   lisinopril 10 MG tablet Commonly known as: ZESTRIL Take 10 mg by mouth daily.   metoprolol succinate 50 MG 24 hr tablet Commonly known as: Toprol XL Take 1 tablet (50 mg total) by mouth daily. Take with or immediately following a meal.   nitroGLYCERIN 0.4 MG SL tablet Commonly known as: NITROSTAT Place 0.4 mg under the tongue every 5 (five) minutes as needed.   pantoprazole 20 MG tablet Commonly known as: PROTONIX Take 1 tablet (20 mg total) by mouth daily.   ticagrelor 90 MG Tabs tablet Commonly known as: BRILINTA Take 1 tablet (90 mg total) by mouth 2 (two) times daily.           Outstanding Labs/Studies     Duration of Discharge Encounter   Greater than 30 minutes including physician time.  Signed, Abagail Kitchens, PA-C 03/22/2023, 9:00 AM

## 2023-03-22 NOTE — Progress Notes (Signed)
   Rounding Note    Patient Name: Aaron Blackburn Date of Encounter: 03/22/2023  [facility] Cardiologist: [name], MD   Subjective   Feels well this AM. Up and moving around room.  Vital Signs    Vitals:   03/22/23 0400 03/22/23 0500 03/22/23 0600 03/22/23 0700  BP: (!) 149/93 (!) 143/94 (!) 145/94 (!) 127/93  Pulse: (!) 56 (!) 53 (!) 59 63  Resp: (!) 0 (!) 21 (!) 22 11  Temp: 98.3 F (36.8 C)     TempSrc: Oral     SpO2: 92% 96% 97% 99%  Weight:      Height:        Intake/Output Summary (Last 24 hours) at 03/22/2023 0805 Last data filed at 03/22/2023 0600 Gross per 24 hour  Intake 1683.6 ml  Output 1875 ml  Net -191.4 ml      03/19/2023    4:26 PM 01/09/2023   10:04 AM 11/13/2022    1:43 PM  Last 3 Weights  Weight (lbs) 180 lb 187 lb 9.6 oz 179 lb  Weight (kg) 81.647 kg 85.095 kg 81.194 kg      Telemetry    Personally Reviewed  ECG    Personally Reviewed  Physical Exam   GEN: No acute distress.   Cardiac: RRR, no murmurs, rubs, or gallops. Radial access site healing well. Distal pulses intact. Respiratory: Clear to auscultation bilaterally. Psych: Normal affect   Assessment & Plan    #STEMI #CAD Doing well after PCI. Hemodynamically stable. EF 55. - cont aspirin - cont brilenta - cont metoprolol  OK to discharge with careful outpatient follow up.    Sheria Lang T. Lalla Brothers, MD, [facility], [facility] Cardiac Electrophysiology

## 2023-03-22 NOTE — Telephone Encounter (Signed)
   Transition of Care Follow-up Phone Call Request    Patient Name: Aaron Blackburn Date of Birth: 1960/11/06 Date of Encounter: 03/22/2023  Primary Care Provider:  Eartha Inch, MD Primary Cardiologist:  [name], MD  Deborha Payment has been scheduled for a transition of care follow up appointment with a [facility] provider:  Friday Apr 04, 2023  Appt at 10:55 AM (25 min)      Evalina Field  Please reach out to Deborha Payment within 48 hours of discharge to confirm appointment and review transition of care protocol questionnaire. Anticipated discharge date: Today  Abagail Kitchens, PA-C  03/22/2023, 8:31 AM

## 2023-03-24 ENCOUNTER — Encounter (HOSPITAL_COMMUNITY): Payer: Self-pay | Admitting: Cardiology

## 2023-03-24 NOTE — Telephone Encounter (Signed)
 Attempted to call patient, no answer left message requesting a call back.

## 2023-03-28 NOTE — Telephone Encounter (Signed)
 2nd attempt to call patient, no answer left message requesting a call back.

## 2023-03-31 ENCOUNTER — Telehealth (HOSPITAL_COMMUNITY): Payer: Self-pay

## 2023-03-31 NOTE — Telephone Encounter (Signed)
 Attempted to call patient in regards to Cardiac Rehab - LM on VM

## 2023-03-31 NOTE — Telephone Encounter (Signed)
 3rd attempt to call patient, no answer, left message requesting a call back Nursing will await for patient to return call

## 2023-04-01 NOTE — Telephone Encounter (Signed)
 Pt spouse called in returning call. She asked if you could put in mychart because has hard time answering the phone, if not she asked if you could call her back.

## 2023-04-01 NOTE — Telephone Encounter (Signed)
 Patient identification verified by 2 forms. Wife Doyal listed in [place] Shirl Ellen, RN    Patient contacted regarding discharge from [facility] on 03/22/23.  Patient understands to follow up with provider Josefa Beauvais, NP on 04/04/23 at 10:55am at [address]  Patient understands discharge instructions? YES Patient understands medications and regiment? YES Patient understands to bring all medications to this visit? YES   Ask patient:  Are you enrolled in My Chart (YES)  If no ask patient if they would like to enroll.    Postop Surgical Patients:                What is your wound status? Any signs/ symptoms of infection (Temp, redness/ red streaks, swelling, purulent drainage, foul odor or smell)? NO              Please do not place any creams/ lotions/ or antibiotic ointment on any surgical incisions/ wounds without physician approval.               Do you have any questions about your medications?  All medications (except pain medications) are to be filled by your Cardiologist AFTER your first post op       appointment with them.  Are you taking your pain medication? NO               How is your pain controlled? Pain level? NO PAIN REPORTED AT THIS TIME.               If you require a refill on pain medications, know that the same medication/ amount may not be prescribed or a refill may not be given.  Please contact your pharmacy for refill requests.               Do you have help at home with ADL's?  If you have home health, have you been contacted or seen by the agency? YES, PATIENT WIFE DIANA AVAILABLE TO HELP WITH ADLs AT HOME              Please refer to your Pre/post surgery booklet, there is a lot of useful information in it that may answer any questions you may have.              Please note that it is ok to remove your surgical dressing, shower (soap/ water), and pat the incision dry.  For surgery related questions staff will route a phone note to CV DIV TCTS [facility]  pool  [facility] [phone] [place], [place] [address]   Patient experienced mild rash following procedure that was addressed by PCP Dr. Kristie.  PCP recommended patient take Benadryl for rash. Patient reports benadryl was effective and experienced no other abnormalities since discharge. No further questions at this time.

## 2023-04-01 NOTE — Progress Notes (Signed)
 Cardiology Clinic Note   Patient Name: Aaron Blackburn Date of Encounter: 04/04/2023  Primary Care Provider:  Sophronia Ozell BROCKS, MD Primary Cardiologist:  Redell Shallow, MD  Patient Profile    Aaron Blackburn 63 year old male presents the clinic today for follow-up evaluation of his coronary artery disease and hypertension.  Past Medical History    Past Medical History:  Diagnosis Date   CAD (coronary artery disease)    GERD (gastroesophageal reflux disease)    Hyperlipidemia    Hypertension    Myocardial infarction [facility])    Obesity    Past Surgical History:  Procedure Laterality Date   CARDIAC CATHETERIZATION     CORONARY LITHOTRIPSY N/A 03/21/2023   Procedure: CORONARY LITHOTRIPSY;  Surgeon: Jordan, Peter M, MD;  Location: [facility] INVASIVE CV LAB;  Service: Cardiovascular;  Laterality: N/A;   CORONARY STENT INTERVENTION N/A 03/21/2023   Procedure: CORONARY STENT INTERVENTION;  Surgeon: Jordan, Peter M, MD;  Location: [facility] INVASIVE CV LAB;  Service: Cardiovascular;  Laterality: N/A;   CORONARY ULTRASOUND/IVUS N/A 03/21/2023   Procedure: Coronary Ultrasound/IVUS;  Surgeon: Jordan, Peter M, MD;  Location: [facility] INVASIVE CV LAB;  Service: Cardiovascular;  Laterality: N/A;   CORONARY/GRAFT ACUTE MI REVASCULARIZATION N/A 03/19/2023   Procedure: Coronary/Graft Acute MI Revascularization;  Surgeon: Jordan, Peter M, MD;  Location: [facility] INVASIVE CV LAB;  Service: Cardiovascular;  Laterality: N/A;   LEFT HEART CATH AND CORONARY ANGIOGRAPHY N/A 03/19/2023   Procedure: LEFT HEART CATH AND CORONARY ANGIOGRAPHY;  Surgeon: Jordan, Peter M, MD;  Location: [facility] INVASIVE CV LAB;  Service: Cardiovascular;  Laterality: N/A;   TONSILLECTOMY      Allergies  No Known Allergies  History of Present Illness    Aaron Blackburn has a PMH of coronary artery disease, STEMI, hypertension, and hyperlipidemia.  He is status post cardiac catheterization with PCI and DES to his RCA and circumflex in 2008.  He was seen by  Dr. Shallow in 2017.  He was admitted to the hospital on 03/19/2023 and discharged on 03/22/2023.  He reports that he was watching football and developed severe chest discomfort.  He contacted EMS.  EKG showed an inferior STEMI.  Code STEMI was activated.  He received aspirin  and nitroglycerin .  He was eventually started on heparin  gtt. and nitroglycerin  gtt.  On arrival to the hospital his pain was 3 out of 10.  He was taken emergently to the cardiac Cath Lab.  No cardiac troponin was drawn however after day 1 his cardiac troponin was greater than 6500 and downtrended.  He underwent staged PCI with circumflex marginal vessel receiving scoring balloon PTCA.  On 03/21/2023 he went back to the Cath Lab and had PCI of his proximal and distal RCA with ultrasound guided shockwave lithotripsy and DES x 2.  He was placed on aspirin  and Brilinta .  His a atorvastatin  Imdur  and ezetimibe  were continued.  His echocardiogram showed preserved EF and normal RV function.  He presents to the clinic today for follow-up evaluation and states he has been eating salads.  We reviewed his staged PCI.  He presents with his wife and daughter.  They expressed understanding.  We discussed modifiable risk factors.  He is tolerating his medication well.  He was previously on 80 mg of atorvastatin  and his HDL cholesterol is noted to be 77.  I reviewed the importance of further reduction.  I will refer to pharmacy lipid clinic for consideration of PCSK9 inhibitor.  He may progress his physical activity  as tolerated and start cardiac rehab.  Will plan follow-up in 3 to 4 months.  Today he denies chest pain, shortness of breath, lower extremity edema, fatigue, palpitations, melena, hematuria, hemoptysis, diaphoresis, weakness, presyncope, syncope, orthopnea, and PND.       Home Medications    Prior to Admission medications   Medication Sig Start Date End Date Taking? Authorizing Provider  aspirin  81 MG tablet Take 81 mg by mouth  daily. Patient not taking: Reported on 03/19/2023    [provider]  atorvastatin  (LIPITOR) 80 MG tablet Take 80 mg by mouth daily. 11/21/22   [provider]  cyanocobalamin (VITAMIN B12) 500 MCG tablet Take 500 mcg by mouth daily.    [provider]  donepezil  (ARICEPT ) 10 MG tablet Take 1 tablet (10 mg total) by mouth at bedtime. 02/26/23   Onita Duos, MD  escitalopram  (LEXAPRO ) 20 MG tablet Take 1 tablet (20 mg total) by mouth daily. 11/13/22   Onita Duos, MD  ezetimibe  (ZETIA ) 10 MG tablet Take 1 tablet (10 mg total) by mouth daily. 03/22/23   Darryle Currier L, PA-C  Inulin (FIBER CHOICE PO) Take 1 tablet by mouth daily.    [provider]  isosorbide  mononitrate (IMDUR ) 30 MG 24 hr tablet Take 0.5 tablets (15 mg total) by mouth daily. 03/22/23   Darryle Currier L, PA-C  lisinopril (ZESTRIL) 10 MG tablet Take 10 mg by mouth daily. 11/21/22   [provider]  metoprolol  succinate (TOPROL  XL) 50 MG 24 hr tablet Take 1 tablet (50 mg total) by mouth daily. Take with or immediately following a meal. 03/22/23 03/21/24  Darryle Currier CROME, PA-C  nitroGLYCERIN  (NITROSTAT ) 0.4 MG SL tablet Place 0.4 mg under the tongue every 5 (five) minutes as needed.    [provider]  pantoprazole  (PROTONIX ) 20 MG tablet Take 1 tablet (20 mg total) by mouth daily. 06/30/20   Lenor Hollering, MD  ticagrelor  (BRILINTA ) 90 MG TABS tablet Take 1 tablet (90 mg total) by mouth 2 (two) times daily. 03/22/23   Darryle Currier CROME, PA-C    Family History    Family History  Problem Relation Age of Onset   Coronary artery disease Mother    He indicated that the status of his mother is unknown.  Social History    Social History   Socioeconomic History   Marital status: Married    Spouse name: doyal   Number of children: 2   Years of education: Not on file   Highest education level: 12th grade  Occupational History    Employer: [employer]  Tobacco Use   Smoking status:  Former   Smokeless tobacco: Never  Vaping Use   Vaping status: Never Used  Substance and Sexual Activity   Alcohol use: Yes    Alcohol/week: 5.0 standard drinks of alcohol    Types: 5 Standard drinks or equivalent per week    Comment: Occasional   Drug use: No   Sexual activity: Yes    Birth control/protection: None  Other Topics Concern   Not on file  Social History Narrative   Not on file   Social Drivers of Health   Financial Resource Strain: Low Risk  (03/28/2023)   Received from [facility]   Overall Financial Resource Strain (CARDIA)    Difficulty of Paying Living Expenses: Not hard at all  Food Insecurity: No Food Insecurity (03/28/2023)   Received from [facility]   Hunger Vital Sign    Worried About Running  Out of Food in the Last Year: Never true    Ran Out of Food in the Last Year: Never true  Transportation Needs: No Transportation Needs (03/28/2023)   Received from [facility]   PRAPARE - Transportation    Lack of Transportation (Medical): No    Lack of Transportation (Non-Medical): No  Physical Activity: Insufficiently Active (08/23/2022)   Received from [facility], [facility]   Exercise Vital Sign    Days of Exercise per Week: 2 days    Minutes of Exercise per Session: 30 min  Stress: No Stress Concern Present (08/23/2022)   Received from [facility], [facility] - Occupational Stress Questionnaire    Feeling of Stress : Not at all  Social Connections: Socially Integrated (08/23/2022)   Received from [facility], [facility]   Social Network    How would you rate your social network (family, work, friends)?: Good participation with social networks  Intimate Partner Violence: Not At Risk (03/20/2023)   Humiliation, Afraid, Rape, and Kick questionnaire    Fear of Current or Ex-Partner: No    Emotionally Abused: No    Physically Abused: No    Sexually Abused: No     Review of Systems    General:   No chills, fever, night sweats or weight changes.  Cardiovascular:  No chest pain, dyspnea on exertion, edema, orthopnea, palpitations, paroxysmal nocturnal dyspnea. Dermatological: No rash, lesions/masses Respiratory: No cough, dyspnea Urologic: No hematuria, dysuria Abdominal:   No nausea, vomiting, diarrhea, bright red blood per rectum, melena, or hematemesis Neurologic:  No visual changes, wkns, changes in mental status. All other systems reviewed and are otherwise negative except as noted above.  Physical Exam    VS:  BP 118/82 (BP Location: Left Arm, Patient Position: Sitting, Cuff Size: Normal)   Pulse (!) 55   Ht 5' 8 (1.727 m)   Wt 183 lb (83 kg)   SpO2 97%   BMI 27.83 kg/m  , BMI Body mass index is 27.83 kg/m. GEN: Well nourished, well developed, in no acute distress. HEENT: normal. Neck: Supple, no JVD, carotid bruits, or masses. Cardiac: RRR, no murmurs, rubs, or gallops. No clubbing, cyanosis, edema.  Radials/DP/PT 2+ and equal bilaterally.  Respiratory:  Respirations regular and unlabored, clear to auscultation bilaterally. GI: Soft, nontender, nondistended, BS + x 4. MS: no deformity or atrophy. Skin: warm and dry, no rash. Neuro:  Strength and sensation are intact. Psych: Normal affect.  Accessory Clinical Findings    Recent Labs: 11/13/2022: TSH 1.390 03/21/2023: ALT 19 03/22/2023: BUN 11; Creatinine, Ser 0.87; Hemoglobin 14.0; Platelets 173; Potassium 3.8; Sodium 138   Recent Lipid Panel    Component Value Date/Time   CHOL 121 03/20/2023 0210   CHOL 135 11/13/2022 1535   TRIG 57 03/20/2023 0210   HDL 33 (L) 03/20/2023 0210   HDL 38 (L) 11/13/2022 1535   CHOLHDL 3.7 03/20/2023 0210   VLDL 11 03/20/2023 0210   LDLCALC 77 03/20/2023 0210   LDLCALC 82 11/13/2022 1535         ECG personally reviewed by me today- EKG Interpretation Date/Time:  Friday April 04 2023 11:29:01 EST Ventricular Rate:  55 PR Interval:  164 QRS Duration:  96 QT  Interval:  462 QTC Calculation: 441 R Axis:   52  Text Interpretation: Sinus bradycardia When compared with ECG of 22-Mar-2023 07:37, T wave inversion no longer evident in Lateral leads Confirmed by Emelia Hazy [phone]) on  04/04/2023 11:42:06 AM    Left heart catheterization 03/19/2023 Prox RCA lesion is 90% stenosed.   Prox RCA to Mid RCA lesion is 40% stenosed.   Dist RCA lesion is 75% stenosed.   1st Mrg lesion is 99% stenosed.   Mid LAD lesion is 50% stenosed.   1st Diag lesion is 75% stenosed.   Balloon angioplasty was performed using a BALLN [place] EMERGE MR 4.0X12.   Post intervention, there is a 15% residual stenosis.   The left ventricular systolic function is normal.   LV end diastolic pressure is moderately elevated.   The left ventricular ejection fraction is greater than 65% by visual estimate.   Recommend uninterrupted dual antiplatelet therapy with Aspirin  81mg  daily and Ticagrelor  90mg  twice daily for a minimum of 12 months (ACS-Class I recommendation).   2 vessel obstructive CAD.  Culprit is the first OM with in stent restenosis and acute thrombus Normal LV function Moderately elevated LVEDP Successful PCI of the first OM with IVUS guidance. Scoring balloon PTCA following by high pressure Shirley balloon.    Plan: DAPT for one year. Anticipate returning on Friday for complex PCI of the RCA   Stage intervention 03/21/2023   Prox RCA lesion is 90% stenosed.   Dist RCA lesion is 75% stenosed.   Prox RCA to Mid RCA lesion is 40% stenosed.   A drug-eluting stent was successfully placed using a SYNERGY XD 3.0X28.   A drug-eluting stent was successfully placed using a SYNERGY XD 3.50X20.   Post intervention, there is a 0% residual stenosis.   Post intervention, there is a 0% residual stenosis.   Post intervention, there is a 40% residual stenosis.   Recommend uninterrupted dual antiplatelet therapy with Aspirin  81mg  daily and Ticagrelor  90mg  twice daily for a minimum of 12  months (ACS-Class I recommendation).   Successful PCI of the proximal and distal RCA with IVUS guidance and Shockwave lithotripsy of the proximal lesion DES x 2     Plan; DAPT for at least one year. Anticipate DC in am   Diagnostic Dominance: Right  Intervention      Diagnostic Dominance: Right  Intervention      Echocardiogram 03/20/2023 1. Left ventricular ejection fraction, by estimation, is 55 to 60%. The  left ventricle has normal function. The left ventricle demonstrates global  hypokinesis. Left ventricular diastolic parameters are consistent with  Grade I diastolic dysfunction  (impaired relaxation).   2. Right ventricular systolic function is normal. The right ventricular  size is normal.   3. Left atrial size was mildly dilated.   4. The mitral valve is normal in structure. Mild mitral valve  regurgitation. No evidence of mitral stenosis.   5. The aortic valve is calcified. There is moderate calcification of the  aortic valve. Aortic valve regurgitation is not visualized. Aortic valve  sclerosis is present, with no evidence of aortic valve stenosis. Aortic  valve mean gradient measures 4.0  mmHg. Aortic valve Vmax measures 1.32 m/s.   6. The inferior vena cava is normal in size with greater than 50%  respiratory variability, suggesting right atrial pressure of 3 mmHg     Assessment & Plan   1.  Inferior STEMI-no chest pain today.  Slowly increasing physical activity.  Underwent staged PCI on 03/19/2023 and then on 03/21/2023.  Details above. Continue Brilinta , aspirin , Imdur , atorvastatin , ezetimibe , metoprolol  Heart healthy low-sodium diet Slowly increase physical activity Cardiac cath sites healing well with no signs of infection. May start cardiac rehab  Hyperlipidemia-LDL 77 on 03/20/2023.. High-fiber diet Continue atorvastatin , aspirin  Referred to pharmacy lipid clinic for consideration of PCSK9 inhibitor  Essential hypertension-BP today  118/82. Continue Imdur , metoprolol  Heart healthy low-sodium diet  Disposition: Follow-up with Dr. Pietro or me in 3-4 months.   Josefa HERO. Ahaan Zobrist NP-C     04/04/2023, 11:42 AM [facility] [address] Office [phone] Fax [phone]    I spent 14 minutes examining this patient, reviewing medications, and using patient centered shared decision making involving their cardiac care.   I spent greater than 20 minutes reviewing their past medical history,  medications, and prior cardiac tests.

## 2023-04-04 ENCOUNTER — Ambulatory Visit: Payer: Commercial Managed Care - HMO | Attending: General Practice | Admitting: General Practice

## 2023-04-04 ENCOUNTER — Encounter: Payer: Self-pay | Admitting: General Practice

## 2023-04-04 VITALS — BP 118/82 | HR 55 | Ht 68.0 in | Wt 183.0 lb

## 2023-04-04 DIAGNOSIS — I10 Essential (primary) hypertension: Secondary | ICD-10-CM

## 2023-04-04 DIAGNOSIS — E782 Mixed hyperlipidemia: Secondary | ICD-10-CM | POA: Diagnosis not present

## 2023-04-04 DIAGNOSIS — I2510 Atherosclerotic heart disease of native coronary artery without angina pectoris: Secondary | ICD-10-CM | POA: Diagnosis not present

## 2023-04-04 MED ORDER — NITROGLYCERIN 0.4 MG SL SUBL: 0.4000 mg | SUBLINGUAL_TABLET | SUBLINGUAL | 3 refills | Status: AC | PRN

## 2023-04-04 NOTE — Patient Instructions (Signed)
 Medication Instructions:  Your physician recommends that you continue on your current medications as directed. Please refer to the Current Medication list given to you today.  *If you need a refill on your cardiac medications before your next appointment, please call your pharmacy*   Follow-Up: At [facility], you and your health needs are our priority.  As part of our continuing mission to provide you with exceptional heart care, we have created designated Provider Care Teams.  These Care Teams include your primary Cardiologist (physician) and Advanced Practice Providers (APPs -  Physician Assistants and Nurse Practitioners) who all work together to provide you with the care you need, when you need it.  We recommend signing up for the patient portal called MyChart.  Sign up information is provided on this After Visit Summary.  MyChart is used to connect with patients for Virtual Visits (Telemedicine).  Patients are able to view lab/test results, encounter notes, upcoming appointments, etc.  Non-urgent messages can be sent to your provider as well.   To learn more about what you can do with MyChart, go to [email or web address].    Your next appointment:   3-4 month(s)  Provider:   Redell Shallow, MD  or Josefa Beauvais, FNP       Other Instructions We will have you come back to discuss lipid therapy with a clinical PharmD.   Ok to resume active lifestyle and participate in cardiac rehab.   High-Fiber Eating Plan Fiber, also called dietary fiber, is found in foods such as fruits, vegetables, whole grains, and beans. A high-fiber diet can be good for your health. Your health care provider may recommend a high-fiber diet to help: Prevent trouble pooping (constipation). Lower your cholesterol. Treat the following conditions: Hemorrhoids. This is inflammation of veins in the anus. Inflammation of specific areas of the digestive tract. Irritable bowel syndrome (IBS). This is a  problem of the large intestine, also called the colon, that sometimes causes belly pain and bloating. Prevent overeating as part of a weight-loss plan. Lower the risk of heart disease, type 2 diabetes, and certain cancers. What are tips for following this plan? Reading food labels  Check the nutrition facts label on foods for the amount of dietary fiber. Choose foods that have 4 grams of fiber or more per serving. The recommended goals for how much fiber you should eat each day include: Males 20 years old or younger: 30-34 g. Males over 39 years old: 28-34 g. Females 53 years old or younger: 25-28 g. Females over 45 years old: 22-25 g. Your daily fiber goal is _____________ g. Shopping Choose whole fruits and vegetables instead of processed. For example, choose apples instead of apple juice or applesauce. Choose a variety of high-fiber foods such as avocados, lentils, oats, and pinto beans. Read the nutrition facts label on foods. Check for foods with added fiber. These foods often have high sugar and salt (sodium) amounts per serving. Cooking Use whole-grain flour for baking and cooking. Cook with brown rice instead of white rice. Make meals that have a lot of beans and vegetables in them, such as chili or vegetable-based soups. Meal planning Start the day with a breakfast that is high in fiber, such as a cereal that has 5 g of fiber or more per serving. Eat breads and cereals that are made with whole-grain flour instead of refined flour or white flour. Eat brown rice, bulgur wheat, or millet instead of white rice. Use beans in place of meat in  soups, salads, and pasta dishes. Be sure that half of the grains you eat each day are whole grains. General information You can get the recommended amount of dietary fiber by: Eating a variety of fruits, vegetables, grains, nuts, and beans. Taking a fiber supplement if you aren't able to eat enough fiber. It's better to get fiber through food  than from a supplement. Slowly increase how much fiber you eat. If you increase the amount of fiber you eat too quickly, you may have bloating, cramping, or gas. Drink plenty of water to help you digest fiber. Choose high-fiber snacks, such as berries, raw vegetables, nuts, and popcorn. What foods should I eat? Fruits Berries. Pears. Apples. Oranges. Avocado. Prunes and raisins. Dried figs. Vegetables Sweet potatoes. Spinach. Kale. Artichokes. Cabbage. Broccoli. Cauliflower. Green peas. Carrots. Squash. Grains Whole-grain breads. Multigrain cereal. Oats and oatmeal. Brown rice. Barley. Bulgur wheat. Millet. Quinoa. Bran muffins. Popcorn. Rye wafer crackers. Meats and other proteins Navy beans, kidney beans, and pinto beans. Soybeans. Split peas. Lentils. Nuts and seeds. Dairy Fiber-fortified yogurt. Fortified means that fiber has been added to the product. Beverages Fiber-fortified soy milk. Fiber-fortified orange juice. Other foods Fiber bars. The items listed above may not be all the foods and drinks you can have. Talk to a dietitian to learn more. What foods should I avoid? Fruits Fruit juice. Cooked, strained fruit. Vegetables Fried potatoes. Canned vegetables. Well-cooked vegetables. Grains White bread. Pasta made with refined flour. White rice. Meats and other proteins Fatty meat. Fried chicken or fried fish. Dairy Milk. Cream cheese. Sour cream. Fats and oils Butters. Beverages Soft drinks. Other foods Cakes and pastries. The items listed above may not be all the foods and drinks you should avoid. Talk to a dietitian to learn more. This information is not intended to replace advice given to you by your health care provider. Make sure you discuss any questions you have with your health care provider. Document Revised: 06/03/2022 Document Reviewed: 06/03/2022 Elsevier Patient Education  2024 [employer].

## 2023-04-08 ENCOUNTER — Telehealth (HOSPITAL_COMMUNITY): Payer: Self-pay

## 2023-04-08 ENCOUNTER — Encounter (HOSPITAL_COMMUNITY): Payer: Self-pay

## 2023-04-08 NOTE — Telephone Encounter (Signed)
 Attempted to call patient in regards to Cardiac Rehab - LM on VM Mailed letter

## 2023-04-12 ENCOUNTER — Other Ambulatory Visit (HOSPITAL_COMMUNITY): Payer: Self-pay

## 2023-04-17 ENCOUNTER — Telehealth: Payer: Self-pay | Admitting: General Practice

## 2023-04-17 NOTE — Telephone Encounter (Signed)
Called spoke to Daughter [name],  RN attempted to call patient without success.   RN informed [name]  1) contact patient's insurance to see if $400 is an deductible an if he pay it will the cost will go down, even with a Co -pay card  2 ) what alternative medication if Flonnie Overman is high a cost like clopidogrel. If Dr Jens Som decide to change.    Patient's daughter aware will have Dr Jens Som to review

## 2023-04-17 NOTE — Telephone Encounter (Signed)
Called and spoke to daughter, [name]. Made daughter and patient aware that message will be sent to appropriate staff for advise. Daughter verbalized discount card received does not reduce cost and would like an alternative medications to replace  Brilinta.

## 2023-04-17 NOTE — Telephone Encounter (Signed)
Thank you for sending me this message.  Typically we have patients stay on Brilinta for at least 3 months to further decrease risk of stent thrombosis.  I will defer recommendations on transitioning from Brilinta to Plavix to Dr. Jens Som.  Please forward patient's request to him for further guidance here.  Thank you for your help.   Thomasene Ripple. Cleaver NP-C

## 2023-04-17 NOTE — Telephone Encounter (Signed)
Pt c/o medication issue:  1. Name of Medication: Brilinta  2. How are you currently taking this medication (dosage and times per day)?   3. Are you having a reaction (difficulty breathing--STAT)?   4. What is your medication issue? Daughter says the Marden Noble is too expensive.   She wants to know if there is something else that he can take in the place of it?

## 2023-04-18 MED ORDER — CLOPIDOGREL BISULFATE 300 MG PO TABS: 300.0000 mg | ORAL_TABLET | Freq: Once | ORAL | 0 refills | Status: AC

## 2023-04-18 MED ORDER — CLOPIDOGREL BISULFATE 75 MG PO TABS: 75.0000 mg | ORAL_TABLET | Freq: Every day | ORAL | 3 refills | Status: DC

## 2023-04-18 NOTE — Telephone Encounter (Signed)
Aaron Bunting, MD to Freddi Starr, RN     04/18/23  8:38 AM DC brilinta; the following day plavix 300 mg x 1 then 75 mg daily thereafter [name]

## 2023-04-18 NOTE — Telephone Encounter (Signed)
Patient identification verified by 2 forms. Marilynn Rail, RN    Called and spoke to patients daughter Donaciano Eva states:  -patient likely took morning dose of Brilinta  Informed Ariana:   -has patient hold night time dose of Brilinta   -Take one time 300mg  Plavix Saturday 1/25  -Begin 75mg  Plavix dose Sunday 1/26  -Both Rx sent to requested pharmacy  Ariana verbalized understanding, no questions at this time

## 2023-04-23 ENCOUNTER — Telehealth (HOSPITAL_COMMUNITY): Payer: Self-pay

## 2023-04-23 NOTE — Telephone Encounter (Signed)
No response from in regards to cardiac rehab.   Closed referral

## 2023-05-14 ENCOUNTER — Ambulatory Visit
Payer: Commercial Managed Care - HMO | Attending: Internal Medicine | Admitting: Pharmacist Clinician (PhC)/ Clinical Pharmacy Specialist

## 2023-05-14 ENCOUNTER — Encounter: Payer: Self-pay | Admitting: Pharmacist Clinician (PhC)/ Clinical Pharmacy Specialist

## 2023-05-14 ENCOUNTER — Other Ambulatory Visit: Payer: Self-pay | Admitting: Pharmacist Clinician (PhC)/ Clinical Pharmacy Specialist

## 2023-05-14 DIAGNOSIS — E7800 Pure hypercholesterolemia, unspecified: Secondary | ICD-10-CM | POA: Diagnosis not present

## 2023-05-14 NOTE — Patient Instructions (Signed)
Your Results:             Your most recent labs Goal  Total Cholesterol 121 < 200  Triglycerides 57 < 150  HDL (happy/good cholesterol) 33 > 40  LDL (lousy/bad cholesterol 77  < 55   Medication changes:  We will start the process to get Leqvio covered by your insurance.  Once the prior authorization is complete, I will call/send a MyChart message to let you know and confirm pharmacy information.   You will take the first two shots 3 months apart, then every 6 months after that.    Lab orders:  We want to repeat labs after 2-3 months.  We will send you a lab order to remind you once we get closer to that time.    J code: [id number]   CPT code for administration: 96372  [facility] NPI: [id number]  [facility] tax ID: [id number]  Hospital tax ID: [id number]    Thank you for choosing [facility]

## 2023-05-14 NOTE — Progress Notes (Signed)
 Leqvio order placed

## 2023-05-14 NOTE — Assessment & Plan Note (Signed)
Assessment: Patient with ASCVD not at LDL goal of < 55 Most recent LDL 77 on 03/22/23 Has been compliant with high intensity statin/ezetimibe : atorvastatin 80, ezetimibe 10 Not able to tolerate statins secondary to myalgias Reviewed options for lowering LDL cholesterol, including  PCSK-9 inhibitors, bempedoic acid and inclisiran.  Discussed mechanisms of action, dosing, side effects, potential decreases in LDL cholesterol and costs.  Also reviewed potential options for patient assistance.  Plan: Patient agreeable to starting Leqvio Repeat labs after:  4 months (after 2nd dose) Lipid Liver function

## 2023-05-14 NOTE — Progress Notes (Signed)
Office Visit    Patient Name: Aaron Blackburn Date of Encounter: 05/14/2023  Primary Care Provider:  Eartha Inch, MD Primary Cardiologist:  [name], MD  Chief Complaint    Hyperlipidemia   Significant Past Medical History   CAD 2008 DES to RCA; 12/24 STEMI staged PCI w/scoring balloon PTCA; distal RCA w/lithotripsy and DES x 2  HTN Controlled on lisinopril 10, metoprolol succ 50              No Known Allergies  History of Present Illness    Aaron Blackburn is a 63 y.o. male patient of Dr Jens Som, in the office today to discuss options for cholesterol management.  Patient had first MI back in 2008, was at the gym and drove home, called his MD, then drove himself to ED.   Has been on atorvastatin since then, had second MI on Christmas 2024.  Doing well now, but concerned about costs, as he chose a high deductible plan for 2025, before the hospital stay.    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 [] Yes [x] No  Do you ever skip doses due to side effects? [] Yes [x] No  Do you have trouble affording your medicines? [] Yes  [x] No - does have very high deductible plan for Rx  Are you ever unable to pick up your medication due to transportation difficulties? [] Yes [x] No   Adherence strategy: 7 day pill minder  Accessory Clinical Findings   Lab Results  Component Value Date   CHOL 121 03/20/2023   HDL 33 (L) 03/20/2023   LDLCALC 77 03/20/2023   TRIG 57 03/20/2023   CHOLHDL 3.7 03/20/2023    Lipoprotein (a)  Date/Time Value Ref Range Status  03/20/2023 02:10 AM 121.4 (H) <75.0 nmol/L  Final    Comment:    (NOTE) Note:  Values greater than or equal to 75.0 nmol/L may       indicate an independent risk factor for CHD,       but must be evaluated with caution when applied       to non-Caucasian populations due to the       influence of genetic factors on Lp(a) across       ethnicities. Performed At: [facility] [address] [place], [place] [id number] Jolene Schimke MD WU:[id number]     Lab Results  Component Value Date   ALT 19 03/21/2023   AST 37 03/21/2023   ALKPHOS 54 03/21/2023   BILITOT 0.9 03/21/2023   Lab Results  Component Value Date   CREATININE 0.87 03/22/2023   BUN 11 03/22/2023   NA 138 03/22/2023   K 3.8 03/22/2023   CL 104 03/22/2023   CO2 24 03/22/2023   Lab Results  Component Value Date   HGBA1C 5.5 11/13/2022    Home Medications    Current Outpatient Medications  Medication Sig Dispense Refill   aspirin 81 MG tablet Take 81 mg by mouth daily.     atorvastatin (LIPITOR) 80 MG tablet Take 80 mg by mouth daily.  clopidogrel (PLAVIX) 75 MG tablet Take 1 tablet (75 mg total) by mouth daily. 90 tablet 3   cyanocobalamin (VITAMIN B12) 500 MCG tablet Take 500 mcg by mouth daily.     donepezil (ARICEPT) 10 MG tablet Take 1 tablet (10 mg total) by mouth at bedtime. 90 tablet 3   escitalopram (LEXAPRO) 20 MG tablet Take 1 tablet (20 mg total) by mouth daily. (Patient taking differently: Take 20 mg by mouth daily. I and 1/2 tablet, 30 mg) 90 tablet 3   ezetimibe (ZETIA) 10 MG tablet Take 1 tablet (10 mg total) by mouth daily. 30 tablet 6   Inulin (FIBER CHOICE PO) Take 1 tablet by mouth daily.     isosorbide mononitrate (IMDUR) 30 MG 24 hr tablet Take 0.5 tablets (15 mg total) by mouth daily. 30 tablet 6   lisinopril (ZESTRIL) 10 MG tablet Take 10 mg by mouth daily.     metoprolol succinate (TOPROL XL) 50 MG 24 hr tablet Take 1 tablet (50 mg total) by mouth daily. Take with or immediately following a meal. 30 tablet 11    nitroGLYCERIN (NITROSTAT) 0.4 MG SL tablet Place 1 tablet (0.4 mg total) under the tongue every 5 (five) minutes as needed. 25 tablet 3   pantoprazole (PROTONIX) 20 MG tablet Take 1 tablet (20 mg total) by mouth daily. 30 tablet 0   No current facility-administered medications for this visit.     Assessment & Plan    Hyperlipidemia Assessment: Patient with ASCVD not at LDL goal of < 55 Most recent LDL 77 on 03/22/23 Has been compliant with high intensity statin/ezetimibe : atorvastatin 80, ezetimibe 10 Not able to tolerate statins secondary to myalgias Reviewed options for lowering LDL cholesterol, including  PCSK-9 inhibitors, bempedoic acid and inclisiran.  Discussed mechanisms of action, dosing, side effects, potential decreases in LDL cholesterol and costs.  Also reviewed potential options for patient assistance.  Plan: Patient agreeable to starting Leqvio Repeat labs after:  4 months (after 2nd dose) Lipid Liver function    Phillips Hay, PharmD CPP [facility] [address]  [place], [place] [address] [phone]  05/14/2023, 11:18 AM

## 2023-05-15 ENCOUNTER — Ambulatory Visit: Payer: Self-pay | Admitting: Neurology

## 2023-05-16 ENCOUNTER — Telehealth: Payer: Self-pay | Admitting: Pharmacy Technician

## 2023-05-16 NOTE — Telephone Encounter (Signed)
Aaron Blackburn has been denied due to patient has not failed preferred medication.  Preferred medication is Repatha.  I have scanned the denial letter to the media tab for your review if you would like to appeal. We will d/c the tx plan.  [name]

## 2023-05-20 ENCOUNTER — Ambulatory Visit: Payer: Self-pay | Admitting: Neurology

## 2023-05-22 ENCOUNTER — Encounter: Payer: Self-pay | Admitting: Cardiology

## 2023-05-22 ENCOUNTER — Ambulatory Visit (INDEPENDENT_AMBULATORY_CARE_PROVIDER_SITE_OTHER): Payer: Self-pay | Admitting: Neurology

## 2023-05-22 ENCOUNTER — Encounter: Payer: Self-pay | Admitting: Neurology

## 2023-05-22 VITALS — BP 137/87 | HR 58 | Ht 70.0 in | Wt 189.0 lb

## 2023-05-22 DIAGNOSIS — F419 Anxiety disorder, unspecified: Secondary | ICD-10-CM | POA: Diagnosis not present

## 2023-05-22 DIAGNOSIS — R4189 Other symptoms and signs involving cognitive functions and awareness: Secondary | ICD-10-CM

## 2023-05-22 MED ORDER — MEMANTINE HCL 10 MG PO TABS: 10.0000 mg | ORAL_TABLET | Freq: Two times a day (BID) | ORAL | 11 refills | Status: DC

## 2023-05-22 MED ORDER — DONEPEZIL HCL 10 MG PO TABS: 10.0000 mg | ORAL_TABLET | Freq: Every day | ORAL | 3 refills | Status: DC

## 2023-05-22 NOTE — Progress Notes (Signed)
 Chief Complaint  Patient presents with   Memory Loss    Rm14, daughter present, Memory loss: 35, pt stated that he feels like memory is fading and that at times he can walk in a room and forget why he went in there      ASSESSMENT AND PLAN  [name] is a 63 y.o. male   Mild cognitive impairment  MoCA examination 17/30, missed 5 out of 5 recalls  MRI of the brain showed mild generalized atrophy, laboratory evaluation showed no treatable etiology  He continues to have slow worsening memory loss, most worrisome for central nervous system degenerative disorder such as Alzheimer's disease,  Continue Aricept 10 mg daily, Namenda 10 mg twice a day  Emphasized importance of vascular risk factor control, hypertension, hyperlipidemia, encouraged him to remain active  Once he has better coverage may consider amyloid removing agent treatment,  Refer to neuropsychologist for baseline  DIAGNOSTIC DATA (LABS, IMAGING, TESTING) - I reviewed patient records, labs, notes, testing and imaging myself where available.   MEDICAL HISTORY:  Aaron Blackburn is a 63 year old male, accompanied by his wife and daughter at today's visit for evaluation of memory loss on November 13, 2022,  seen in request by his primary care physician Dr. Cyndia Bent, Kayleen Memos, MD   I reviewed and summarized the referring note. PMHX. GERD HLD CAD in 2008 HTN Anxiety.  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, this also caused extra stress of adapting to the new environment,  He already had underlying anxiety disorder, over the past many years taking Lexapro 10 mg every day, family noted that going through the stress, he has noticeable worsening memory loss, word finding difficulties, misplace things, but denies difficulty driving, or handling his job  He is now on day shift, going to work night shift 5 PM to 5 AM soon, overall sleep well, but has frequent  awakening, able to go back to sleep, loud snoring, sometimes catching his breath, excessive daytime sleepiness, fatigue, like to take a nap  His father died of dementia at age 64  Today's MoCA examination 20/30  UPDATE May 22 2023: Accompanied by his daughter at today's clinical visit, he continues to have memory loss, had a history of anxiety previously, overall under good control  Hospital admission on March 19, 2023 after developed chest pain watching football, suffered STEMI, had a coronary artery stent, LDL 77,  He remain physically active, in good mood, repeat Moca today 17 out of 30, Reviewed MRI of the brain September 2024, mild generalized atrophy few small vessel disease no acute abnormality  Laboratory evaluation from December 2024, normal hemoglobin of 14, BMP, previously normal B12, TSH RPR, ESR C-reactive protein negative ANA PHYSICAL EXAM:   Vitals:   05/22/23 1303  BP: 137/87  Pulse: (!) 58  Weight: 189 lb (85.7 kg)  Height: 5\' 10"  (1.778 m)   Body mass index is 27.12 kg/m.  PHYSICAL EXAMNIATION:  Gen: NAD, conversant, well nourised, well groomed                     Cardiovascular: Regular rate rhythm, no peripheral edema, warm, nontender. Eyes: Conjunctivae clear without exudates or hemorrhage Neck: Supple, no carotid bruits. 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 Pupils are round equal and briskly reactive to light. CN III, IV, VI: extraocular movement are normal. No ptosis. CN V: Facial sensation is intact to light touch CN VII: Face is symmetric with normal eye closure  CN VIII: Hearing is normal to causal conversation. CN IX, X: Phonation is normal. CN XI: Head turning and shoulder shrug are intact  MOTOR: There is no pronator drift of out-stretched arms. Muscle bulk and tone are normal. Muscle strength is normal.  REFLEXES: Reflexes are 2+ and symmetric at the biceps, triceps, knees, and ankles. Plantar responses are flexor.  SENSORY: Intact to light touch, pinprick and vibratory sensation are intact in fingers and toes.  COORDINATION: There is no trunk or limb dysmetria noted.  GAIT/STANCE: Posture is normal. Gait is steady with normal steps, base, arm swing, and turning. Heel and toe walking are normal. Tandem gait is normal.  Romberg is absent.  REVIEW OF SYSTEMS:  Full 14 system review of systems performed and notable only for as above All other review of systems were negative.   ALLERGIES: No Known Allergies  HOME MEDICATIONS: Current Outpatient Medications  Medication Sig Dispense Refill   aspirin 81 MG tablet Take 81 mg by mouth daily.     atorvastatin (LIPITOR) 80 MG tablet Take 80 mg by mouth daily.     clopidogrel (PLAVIX) 75 MG tablet Take 1 tablet (75 mg total) by mouth daily. 90 tablet 3   cyanocobalamin (VITAMIN B12) 500 MCG tablet Take 500 mcg by mouth daily.     donepezil (ARICEPT) 10 MG tablet Take 1 tablet (10 mg total) by mouth at bedtime. 90 tablet 3   escitalopram (LEXAPRO) 20 MG tablet Take 1 tablet (20 mg total) by mouth daily. (Patient taking differently: Take 20 mg by mouth daily. I and 1/2 tablet, 30 mg) 90 tablet 3   ezetimibe (ZETIA) 10 MG tablet Take 1 tablet (10 mg total) by mouth daily. 30 tablet 6   Inulin (FIBER CHOICE  PO) Take 1 tablet by mouth daily.     isosorbide mononitrate (IMDUR) 30 MG 24 hr tablet Take 0.5 tablets (15 mg total) by mouth daily. 30 tablet 6   metoprolol succinate (TOPROL XL) 50 MG 24 hr tablet Take 1 tablet (50 mg total) by mouth daily. Take with or immediately following a meal. 30 tablet 11   nitroGLYCERIN (NITROSTAT) 0.4 MG SL tablet Place 1 tablet (0.4 mg total) under the tongue every 5 (five) minutes as needed. 25 tablet 3   pantoprazole (PROTONIX) 20 MG tablet Take 1 tablet (20 mg total) by mouth daily. 30 tablet 0   No current facility-administered medications for this visit.    PAST MEDICAL HISTORY: Past Medical History:  Diagnosis Date   CAD (coronary artery disease)    GERD (gastroesophageal reflux disease)    Hyperlipidemia    Hypertension    Myocardial infarction (HCC)  Obesity     PAST SURGICAL HISTORY: Past Surgical History:  Procedure Laterality Date   CARDIAC CATHETERIZATION     CORONARY LITHOTRIPSY N/A 03/21/2023   Procedure: CORONARY LITHOTRIPSY;  Surgeon: Swaziland, Peter M, MD;  Location: [facility] INVASIVE CV LAB;  Service: Cardiovascular;  Laterality: N/A;   CORONARY STENT INTERVENTION N/A 03/21/2023   Procedure: CORONARY STENT INTERVENTION;  Surgeon: Swaziland, Peter M, MD;  Location: [facility] INVASIVE CV LAB;  Service: Cardiovascular;  Laterality: N/A;   CORONARY ULTRASOUND/IVUS N/A 03/21/2023   Procedure: Coronary Ultrasound/IVUS;  Surgeon: Swaziland, Peter M, MD;  Location: [facility] INVASIVE CV LAB;  Service: Cardiovascular;  Laterality: N/A;   CORONARY/GRAFT ACUTE MI REVASCULARIZATION N/A 03/19/2023   Procedure: Coronary/Graft Acute MI Revascularization;  Surgeon: Swaziland, Peter M, MD;  Location: [facility] INVASIVE CV LAB;  Service: Cardiovascular;  Laterality: N/A;   LEFT HEART CATH AND CORONARY ANGIOGRAPHY N/A 03/19/2023   Procedure: LEFT HEART CATH AND CORONARY ANGIOGRAPHY;  Surgeon: Swaziland, Peter M, MD;  Location: [facility] INVASIVE CV LAB;  Service: Cardiovascular;  Laterality: N/A;    TONSILLECTOMY      FAMILY HISTORY: Family History  Problem Relation Age of Onset   Coronary artery disease Mother     SOCIAL HISTORY: Social History   Socioeconomic History   Marital status: Married    Spouse name: [name]   Number of children: 2   Years of education: Not on file   Highest education level: 12th grade  Occupational History    Employer: [employer]  Tobacco Use   Smoking status: Former   Smokeless tobacco: Never  Vaping Use   Vaping status: Never Used  Substance and Sexual Activity   Alcohol use: Yes    Alcohol/week: 5.0 standard drinks of alcohol    Types: 5 Standard drinks or equivalent per week    Comment: Occasional   Drug use: No   Sexual activity: Yes    Birth control/protection: None  Other Topics Concern   Not on file  Social History Narrative   Not on file   Social Drivers of Health   Financial Resource Strain: Low Risk  (03/28/2023)   Received from [facility]   Overall Financial Resource Strain (CARDIA)    Difficulty of Paying Living Expenses: Not hard at all  Food Insecurity: No Food Insecurity (03/28/2023)   Received from [facility]   Hunger Vital Sign    Worried About Running Out of Food in the Last Year: Never true    Ran Out of Food in the Last Year: Never true  Transportation Needs: No Transportation Needs (03/28/2023)   Received from [facility] - Transportation    Lack of Transportation (Medical): No    Lack of Transportation (Non-Medical): No  Physical Activity: Insufficiently Active (08/23/2022)   Received from [facility]   Exercise Vital Sign    Days of Exercise per Week: 2 days    Minutes of Exercise per Session: 30 min  Stress: No Stress Concern Present (08/23/2022)   Received from [facility] - Occupational Stress Questionnaire    Feeling of Stress : Not at all  Social Connections: Socially Integrated (08/23/2022)   Received  from [facility]   Social Network    How would you rate your social network (family, work, friends)?: Good participation with social networks  Intimate Partner Violence: Not At Risk (03/20/2023)   Humiliation, Afraid, Rape, and Kick questionnaire  Fear of Current or Ex-Partner: No    Emotionally Abused: No    Physically Abused: No    Sexually Abused: No      Levert Feinstein, M.D. Ph.D.  [facility] [address] [place], [place] [address] Ph: [phone] Fax: [phone]  CC:  Eartha Inch, MD [address] [place],  [place] [address]  Eartha Inch, MD

## 2023-05-30 ENCOUNTER — Telehealth: Payer: Self-pay | Admitting: Pharmacist Clinician (PhC)/ Clinical Pharmacy Specialist

## 2023-05-30 NOTE — Telephone Encounter (Signed)
 Leqvio denied, must try/fail Repatha first

## 2023-06-02 ENCOUNTER — Other Ambulatory Visit (HOSPITAL_COMMUNITY): Payer: Self-pay

## 2023-06-02 ENCOUNTER — Telehealth: Payer: Self-pay | Admitting: Pharmacy Technician

## 2023-06-02 NOTE — Telephone Encounter (Signed)
 Pharmacy Patient Advocate Encounter   Received notification from Physician's Office that prior authorization for Repatha is required/requested.   Insurance verification completed.   The patient is insured through [employer] .   Per test claim: PA required; PA submitted to above mentioned insurance via CoverMyMeds Key/confirmation #/EOC [facility] Status is pending

## 2023-06-03 ENCOUNTER — Other Ambulatory Visit (HOSPITAL_COMMUNITY): Payer: Self-pay

## 2023-06-03 NOTE — Telephone Encounter (Signed)
 Pharmacy Patient Advocate Encounter  Received notification from [employer] that Prior Authorization for Repatha has been APPROVED from 06/02/23 to 06/01/24. Ran test claim, Copay is $24.99- one month. This test claim was processed through [facility]- copay amounts may vary at other pharmacies due to pharmacy/plan contracts, or as the patient moves through the different stages of their insurance plan.   PA #/Case ID/Reference #: [id number]

## 2023-06-06 MED ORDER — REPATHA SURECLICK 140 MG/ML ~~LOC~~ SOAJ: 140.0000 mg | SUBCUTANEOUS | 3 refills | Status: DC

## 2023-06-06 NOTE — Addendum Note (Signed)
 Addended by: Rosalee Kaufman on: 06/06/2023 07:09 AM   Modules accepted: Orders

## 2023-06-10 ENCOUNTER — Telehealth: Payer: Self-pay

## 2023-06-10 NOTE — Telephone Encounter (Signed)
 Pts daughter called in and asked for copay card for repatha so I assisted in the process:  Pt daughter voiced gratitude and understanding of all discussed

## 2023-07-10 NOTE — Telephone Encounter (Signed)
 Pt daughter called and stated that the pt copay was still high and that she plans to switch the insurance at the end of year and will revisit the idea of a pcskp inhibitor then.

## 2023-07-10 NOTE — Addendum Note (Signed)
 Addended by: Mollie Anger E on: 07/10/2023 11:58 AM   Modules accepted: Orders

## 2023-07-21 NOTE — Progress Notes (Signed)
 HPI: Fu coronary artery disease. Patient is status post myocardial infarction in February of 2008. Cardiac catheterization at that time showed a normal left main, 30% proximal LAD, 50% mid lesion. The circumflex had a 95% proximal OM. The patient had primary PCI of his right coronary artery. He subsequently had PCI of the marginal. Repeat MI 12/24 90 proximal RCA and 75 distal; 99 OM1; 50 LAD and 75 diagonal; EF 65. Had PTCA of OM; subsequently had staged PCI of the right coronary artery with 2 drug-eluting stents.  Echocardiogram December 2024 showed normal LV function, grade 1 diastolic dysfunction, mild left atrial enlargement, mild mitral regurgitation. Since last seen, patient denies dyspnea, chest pain, palpitations or syncope.  Current Outpatient Medications  Medication Sig Dispense Refill   aspirin  81 MG tablet Take 81 mg by mouth daily.     atorvastatin  (LIPITOR) 80 MG tablet Take 80 mg by mouth daily.     clopidogrel  (PLAVIX ) 75 MG tablet Take 1 tablet (75 mg total) by mouth daily. 90 tablet 3   cyanocobalamin (VITAMIN B12) 500 MCG tablet Take 500 mcg by mouth daily.     donepezil  (ARICEPT ) 10 MG tablet Take 1 tablet (10 mg total) by mouth at bedtime. 90 tablet 3   escitalopram  (LEXAPRO ) 20 MG tablet Take 1 tablet (20 mg total) by mouth daily. (Patient taking differently: Take 20 mg by mouth daily. I and 1/2 tablet, 30 mg) 90 tablet 3   ezetimibe  (ZETIA ) 10 MG tablet Take 1 tablet (10 mg total) by mouth daily. 30 tablet 6   Inulin (FIBER CHOICE PO) Take 1 tablet by mouth daily.     isosorbide  mononitrate (IMDUR ) 30 MG 24 hr tablet Take 0.5 tablets (15 mg total) by mouth daily. 30 tablet 6   memantine  (NAMENDA ) 10 MG tablet Take 1 tablet (10 mg total) by mouth 2 (two) times daily. 60 tablet 11   metoprolol  succinate (TOPROL  XL) 50 MG 24 hr tablet Take 1 tablet (50 mg total) by mouth daily. Take with or immediately following a meal. 30 tablet 11   nitroGLYCERIN  (NITROSTAT ) 0.4 MG SL  tablet Place 1 tablet (0.4 mg total) under the tongue every 5 (five) minutes as needed. 25 tablet 3   pantoprazole  (PROTONIX ) 20 MG tablet Take 1 tablet (20 mg total) by mouth daily. 30 tablet 0   No current facility-administered medications for this visit.     Past Medical History:  Diagnosis Date   CAD (coronary artery disease)    GERD (gastroesophageal reflux disease)    Hyperlipidemia    Hypertension    Myocardial infarction [facility])    Obesity     Past Surgical History:  Procedure Laterality Date   CARDIAC CATHETERIZATION     CORONARY LITHOTRIPSY N/A 03/21/2023   Procedure: CORONARY LITHOTRIPSY;  Surgeon: Swaziland, Peter M, MD;  Location: [facility] INVASIVE CV LAB;  Service: Cardiovascular;  Laterality: N/A;   CORONARY STENT INTERVENTION N/A 03/21/2023   Procedure: CORONARY STENT INTERVENTION;  Surgeon: Swaziland, Peter M, MD;  Location: [facility] INVASIVE CV LAB;  Service: Cardiovascular;  Laterality: N/A;   CORONARY ULTRASOUND/IVUS N/A 03/21/2023   Procedure: Coronary Ultrasound/IVUS;  Surgeon: Swaziland, Peter M, MD;  Location: [facility] INVASIVE CV LAB;  Service: Cardiovascular;  Laterality: N/A;   CORONARY/GRAFT ACUTE MI REVASCULARIZATION N/A 03/19/2023   Procedure: Coronary/Graft Acute MI Revascularization;  Surgeon: Swaziland, Peter M, MD;  Location: [facility] INVASIVE CV LAB;  Service: Cardiovascular;  Laterality: N/A;   LEFT HEART CATH AND CORONARY ANGIOGRAPHY  N/A 03/19/2023   Procedure: LEFT HEART CATH AND CORONARY ANGIOGRAPHY;  Surgeon: Swaziland, Peter M, MD;  Location: [facility] INVASIVE CV LAB;  Service: Cardiovascular;  Laterality: N/A;   TONSILLECTOMY      Social History   Socioeconomic History   Marital status: Married    Spouse name: Ave Leisure   Number of children: 2   Years of education: Not on file   Highest education level: 12th grade  Occupational History    Employer: [employer]  Tobacco Use   Smoking status: Former   Smokeless tobacco: Never  Vaping Use   Vaping status: Never Used   Substance and Sexual Activity   Alcohol use: Yes    Alcohol/week: 5.0 standard drinks of alcohol    Types: 5 Standard drinks or equivalent per week    Comment: Occasional   Drug use: No   Sexual activity: Yes    Birth control/protection: None  Other Topics Concern   Not on file  Social History Narrative   Not on file   Social Drivers of Health   Financial Resource Strain: Low Risk  (03/28/2023)   Received from [facility]   Overall Financial Resource Strain (CARDIA)    Difficulty of Paying Living Expenses: Not hard at all  Food Insecurity: No Food Insecurity (03/28/2023)   [facility]   Hunger Vital Sign    Worried About Running Out of Food in the Last Year: Never true    Ran Out of Food in the Last Year: Never true  Transportation Needs: No Transportation Needs (03/28/2023)   Received from [facility] - Transportation    Lack of Transportation (Medical): No    Lack of Transportation (Non-Medical): No  Physical Activity: Insufficiently Active (08/23/2022)   Received from [facility]   Exercise Vital Sign    Days of Exercise per Week: 2 days    Minutes of Exercise per Session: 30 min  Stress: No Stress Concern Present (08/23/2022)   Received from [facility] Health - Occupational Stress Questionnaire    Feeling of Stress : Not at all  Social Connections: Socially Integrated (08/23/2022)   Received from [facility], [facility]   Social Network    How would you rate your social network (family, work, friends)?: Good participation with social networks  Intimate Partner Violence: Not At Risk (03/20/2023)   Humiliation, Afraid, Rape, and Kick questionnaire    Fear of Current or Ex-Partner: No    Emotionally Abused: No    Physically Abused: No    Sexually Abused: No    Family History  Problem Relation Age of Onset   Coronary artery disease Mother     ROS: Some muscle aches  but no fevers or chills, productive cough, hemoptysis, dysphasia, odynophagia, melena, hematochezia, dysuria, hematuria, rash, seizure activity, orthopnea, PND, pedal edema, claudication. Remaining systems are negative.  Physical Exam: Well-developed well-nourished in no acute distress.  Skin is warm and dry.  HEENT is normal.  Neck is supple.  Chest is clear to auscultation with normal expansion.  Cardiovascular exam is regular rate and rhythm.  Abdominal exam nontender or distended. No masses palpated. Extremities show no edema. neuro grossly intact  A/P  1 coronary artery disease-status post PCI in December 2024 as outlined above.  Continue aspirin , Brilinta , metoprolol  and statin.  He denies recurrent chest pain.  2 hyperlipidemia-continue Lipitor and Zetia .  Repatha  was previously described as well but  he cannot afford.  Check lipids, liver and CK.  3 hypertension-blood pressure is controlled.  Continue present medical regimen.  Alexandria Angel, MD

## 2023-08-01 ENCOUNTER — Ambulatory Visit: Payer: Commercial Managed Care - HMO | Attending: Cardiology | Admitting: Cardiology

## 2023-08-01 ENCOUNTER — Encounter: Payer: Self-pay | Admitting: Cardiology

## 2023-08-01 VITALS — BP 120/78 | HR 55 | Ht 68.0 in | Wt 189.4 lb

## 2023-08-01 DIAGNOSIS — I10 Essential (primary) hypertension: Secondary | ICD-10-CM | POA: Diagnosis not present

## 2023-08-01 DIAGNOSIS — E7800 Pure hypercholesterolemia, unspecified: Secondary | ICD-10-CM | POA: Diagnosis not present

## 2023-08-01 DIAGNOSIS — I2510 Atherosclerotic heart disease of native coronary artery without angina pectoris: Secondary | ICD-10-CM

## 2023-08-01 NOTE — Patient Instructions (Signed)
 Medication Instructions:  No Changes *If you need a refill on your cardiac medications before your next appointment, please call your pharmacy*  Lab Work: Today: Lipid Panel, Liver Panel (Hepatic Function), CK (Creatine Kinase)  Testing/Procedures: None  Follow-Up: At [facility], you and your health needs are our priority.  As part of our continuing mission to provide you with exceptional heart care, our providers are all part of one team.  This team includes your primary Cardiologist (physician) and Advanced Practice Providers or APPs (Physician Assistants and Nurse Practitioners) who all work together to provide you with the care you need, when you need it.  Your next appointment:   1 year(s)  Provider:   Alexandria Angel, MD   Other Instructions Please call us  or send a MyChart message with any Cardiology related questions/concerns.  [phone].  Thank you!

## 2023-08-02 LAB — HEPATIC FUNCTION PANEL
ALT: 24 IU/L (ref 0–44)
AST: 30 IU/L (ref 0–40)
Albumin: 4.1 g/dL (ref 3.9–4.9)
Alkaline Phosphatase: 91 IU/L (ref 44–121)
Bilirubin Total: 0.6 mg/dL (ref 0.0–1.2)
Bilirubin, Direct: 0.22 mg/dL (ref 0.00–0.40)
Total Protein: 5.7 g/dL — ABNORMAL LOW (ref 6.0–8.5)

## 2023-08-02 LAB — LIPID PANEL
Chol/HDL Ratio: 3.4 ratio (ref 0.0–5.0)
Cholesterol, Total: 105 mg/dL (ref 100–199)
HDL: 31 mg/dL — ABNORMAL LOW (ref >39)
LDL Chol Calc (NIH): 56 mg/dL (ref 0–99)
Triglycerides: 92 mg/dL (ref 0–149)
VLDL Cholesterol Cal: 18 mg/dL (ref 5–40)

## 2023-08-02 LAB — CK: Total CK: 115 U/L (ref 41–331)

## 2023-08-04 ENCOUNTER — Encounter: Payer: Self-pay | Admitting: *Deleted

## 2023-08-06 ENCOUNTER — Ambulatory Visit: Payer: Self-pay | Admitting: *Deleted

## 2023-10-30 ENCOUNTER — Encounter: Payer: Self-pay | Admitting: Cardiology

## 2023-10-30 ENCOUNTER — Other Ambulatory Visit: Payer: Self-pay | Admitting: *Deleted

## 2023-10-31 MED ORDER — EZETIMIBE 10 MG PO TABS: 10.0000 mg | ORAL_TABLET | Freq: Every day | ORAL | 3 refills | Status: DC

## 2023-10-31 NOTE — Addendum Note (Signed)
 Addended by: CHAUVIGNE, Quiana Cobaugh on: 10/31/2023 08:18 AM   Modules accepted: Orders

## 2023-12-23 ENCOUNTER — Telehealth: Payer: Self-pay

## 2023-12-23 NOTE — Telephone Encounter (Signed)
 Ske w/pts daughter via phone who wanted to know if father can be exempt from jury duty due to moca of 16

## 2023-12-24 NOTE — Telephone Encounter (Signed)
 Ok to be exempted from [employer] duty, please have letter ready for me to sign

## 2024-03-28 ENCOUNTER — Encounter: Payer: Self-pay | Admitting: Neurology

## 2024-03-28 ENCOUNTER — Encounter: Payer: Self-pay | Admitting: Cardiology

## 2024-03-28 NOTE — Progress Notes (Deleted)
 "   Cardiology Clinic Note   Patient Name: Aaron Blackburn Date of Encounter: 03/28/2024  Primary Care Provider:  Sophronia Ozell BROCKS, MD Primary Cardiologist:  Redell Shallow, MD  Patient Profile    Aaron Blackburn Barrier 64 year old male presents the clinic today for follow-up evaluation of his coronary artery disease and hypertension.  Past Medical History    Past Medical History:  Diagnosis Date   CAD (coronary artery disease)    GERD (gastroesophageal reflux disease)    Hyperlipidemia    Hypertension    Myocardial infarction [facility])    Obesity    Past Surgical History:  Procedure Laterality Date   CARDIAC CATHETERIZATION     CORONARY LITHOTRIPSY N/A 03/21/2023   Procedure: CORONARY LITHOTRIPSY;  Surgeon: Jordan, Peter M, MD;  Location: [facility] INVASIVE CV LAB;  Service: Cardiovascular;  Laterality: N/A;   CORONARY STENT INTERVENTION N/A 03/21/2023   Procedure: CORONARY STENT INTERVENTION;  Surgeon: Jordan, Peter M, MD;  Location: [facility] INVASIVE CV LAB;  Service: Cardiovascular;  Laterality: N/A;   CORONARY ULTRASOUND/IVUS N/A 03/21/2023   Procedure: Coronary Ultrasound/IVUS;  Surgeon: Jordan, Peter M, MD;  Location: [facility] INVASIVE CV LAB;  Service: Cardiovascular;  Laterality: N/A;   CORONARY/GRAFT ACUTE MI REVASCULARIZATION N/A 03/19/2023   Procedure: Coronary/Graft Acute MI Revascularization;  Surgeon: Jordan, Peter M, MD;  Location: [facility] INVASIVE CV LAB;  Service: Cardiovascular;  Laterality: N/A;   LEFT HEART CATH AND CORONARY ANGIOGRAPHY N/A 03/19/2023   Procedure: LEFT HEART CATH AND CORONARY ANGIOGRAPHY;  Surgeon: Jordan, Peter M, MD;  Location: [facility] INVASIVE CV LAB;  Service: Cardiovascular;  Laterality: N/A;   TONSILLECTOMY      Allergies  No Known Allergies  History of Present Illness    Aaron Blackburn has a PMH of coronary artery disease, STEMI, hypertension, and hyperlipidemia.  He is status post cardiac catheterization with PCI and DES to his RCA and circumflex in 2008.  He was seen by  Dr. Shallow in 2017.  He was admitted to the hospital on 03/19/2023 and discharged on 03/22/2023.  He reports that he was watching football and developed severe chest discomfort.  He contacted EMS.  EKG showed an inferior STEMI.  Code STEMI was activated.  He received aspirin  and nitroglycerin .  He was eventually started on heparin  gtt. and nitroglycerin  gtt.  On arrival to the hospital his pain was 3 out of 10.  He was taken emergently to the cardiac Cath Lab.  No cardiac troponin was drawn however after day 1 his cardiac troponin was greater than 6500 and downtrended.  He underwent staged PCI with circumflex marginal vessel receiving scoring balloon PTCA.  On 03/21/2023 he went back to the Cath Lab and had PCI of his proximal and distal RCA with ultrasound guided shockwave lithotripsy and DES x 2.  He was placed on aspirin  and Brilinta .  His a atorvastatin  Imdur  and ezetimibe  were continued.  His echocardiogram showed preserved EF and normal RV function.  He presented to the clinic 04/04/23 for follow-up evaluation and stated he had been eating salads.  We reviewed his staged PCI.  He presented with his wife and daughter.  They expressed understanding.  We discussed modifiable risk factors.  He was tolerating his medication well.  He was previously on 80 mg of atorvastatin  and his HDL cholesterol was noted to be 77.  I reviewed the importance of further reduction.  I refered to pharmacy lipid clinic for consideration of PCSK9 inhibitor. Follow-up in 3 to 4 months was  planned.   He was seen in follow-up by Dr. Pietro on 08/01/2023.  He remained stable from a cardiac standpoint at that time.  He denied dyspnea, chest pain, palpitations and syncope.  His liver function panel showed normal AST and ALT.  His lipid panel showed total cholesterol 105 and LDL of 56.  His medication regimen was continued.  He presents to the clinic today for follow-up evaluation.  He states***.  Today he denies chest pain,  shortness of breath, lower extremity edema, fatigue, palpitations, melena, hematuria, hemoptysis, diaphoresis, weakness, presyncope, syncope, orthopnea, and PND.       Home Medications    Prior to Admission medications   Medication Sig Start Date End Date Taking? Authorizing Provider  aspirin  81 MG tablet Take 81 mg by mouth daily. Patient not taking: Reported on 03/19/2023    [provider]  atorvastatin  (LIPITOR) 80 MG tablet Take 80 mg by mouth daily. 11/21/22   [provider]  cyanocobalamin (VITAMIN B12) 500 MCG tablet Take 500 mcg by mouth daily.    [provider]  donepezil  (ARICEPT ) 10 MG tablet Take 1 tablet (10 mg total) by mouth at bedtime. 02/26/23   Onita Duos, MD  escitalopram  (LEXAPRO ) 20 MG tablet Take 1 tablet (20 mg total) by mouth daily. 11/13/22   Onita Duos, MD  ezetimibe  (ZETIA ) 10 MG tablet Take 1 tablet (10 mg total) by mouth daily. 03/22/23   Darryle Currier L, PA-C  Inulin (FIBER CHOICE PO) Take 1 tablet by mouth daily.    [provider]  isosorbide  mononitrate (IMDUR ) 30 MG 24 hr tablet Take 0.5 tablets (15 mg total) by mouth daily. 03/22/23   Darryle Currier L, PA-C  lisinopril (ZESTRIL) 10 MG tablet Take 10 mg by mouth daily. 11/21/22   [provider]  metoprolol  succinate (TOPROL  XL) 50 MG 24 hr tablet Take 1 tablet (50 mg total) by mouth daily. Take with or immediately following a meal. 03/22/23 03/21/24  Darryle Currier CROME, PA-C  nitroGLYCERIN  (NITROSTAT ) 0.4 MG SL tablet Place 0.4 mg under the tongue every 5 (five) minutes as needed.    [provider]  pantoprazole  (PROTONIX ) 20 MG tablet Take 1 tablet (20 mg total) by mouth daily. 06/30/20   Lenor Hollering, MD  ticagrelor  (BRILINTA ) 90 MG TABS tablet Take 1 tablet (90 mg total) by mouth 2 (two) times daily. 03/22/23   Darryle Currier CROME, PA-C    Family History    Family History  Problem Relation Age of Onset   Coronary artery disease Mother    He indicated that  the status of his mother is unknown.  Social History    Social History   Socioeconomic History   Marital status: Married    Spouse name: doyal   Number of children: 2   Years of education: Not on file   Highest education level: 12th grade  Occupational History    Employer: [employer]  Tobacco Use   Smoking status: Former   Smokeless tobacco: Never  Vaping Use   Vaping status: Never Used  Substance and Sexual Activity   Alcohol use: Yes    Alcohol/week: 5.0 standard drinks of alcohol    Types: 5 Standard drinks or equivalent per week    Comment: Occasional   Drug use: No   Sexual activity: Yes    Birth control/protection: None  Other Topics Concern   Not on file  Social History Narrative   Not on file   Social Drivers of  Health   Tobacco Use: Low Risk (12/16/2023)   Received from [facility]   Patient History    Smoking Tobacco Use: Never    Smokeless Tobacco Use: Never    Passive Exposure: Never  Financial Resource Strain: Low Risk (12/15/2023)   Received from [facility]   Overall Financial Resource Strain (CARDIA)    How hard is it for you to pay for the very basics like food, housing, medical care, and heating?: Not hard at all  Food Insecurity: No Food Insecurity (12/15/2023)   Received from [facility]   Epic    Within the past 12 months, you worried that your food would run out before you got the money to buy more.: Never true    Within the past 12 months, the food you bought just didn't last and you didn't have money to get more.: Never true  Transportation Needs: No Transportation Needs (12/15/2023)   Received from [facility]   Epic    In the past 12 months, has lack of transportation kept you from medical appointments or from getting medications?: No    In the past 12 months, has lack of transportation kept you from meetings, work, or from getting things needed for daily living?: No  Physical Activity: Insufficiently Active (12/15/2023)    Received from [facility]   Exercise Vital Sign    On average, how many days per week do you engage in moderate to strenuous exercise (like a brisk walk)?: 3 days    On average, how many minutes do you engage in exercise at this level?: 30 min  Stress: No Stress Concern Present (08/23/2022)   Received from [facility] of Occupational Health - Occupational Stress Questionnaire    Feeling of Stress : Not at all  Social Connections: Socially Integrated (12/15/2023)   Received from [facility]   Social Network    How would you rate your social network (family, work, friends)?: Good participation with social networks  Intimate Partner Violence: Not At Risk (12/15/2023)   Received from [facility]   HITS    Over the last 12 months how often did your partner physically hurt you?: Never    Over the last 12 months how often did your partner insult you or talk down to you?: Never    Over the last 12 months how often did your partner threaten you with physical harm?: Never    Over the last 12 months how often did your partner scream or curse at you?: Never  Depression (PHQ2-9): Not on file  Alcohol Screen: Not on file  Housing: Low Risk (12/15/2023)   Received from [facility]    In the last 12 months, was there a time when you were not able to pay the mortgage or rent on time?: No    In the past 12 months, how many times have you moved where you were living?: 0    At any time in the past 12 months, were you homeless or living in a shelter (including now)?: No  Utilities: Not At Risk (12/15/2023)   Received from [facility]    In the past 12 months has the electric, gas, oil, or water company threatened to shut off services in your home?: No  Health Literacy: Not on file     Review of Systems    General:  No chills, fever, night sweats or weight changes.  Cardiovascular:  No chest  pain, dyspnea on exertion, edema, orthopnea, palpitations, paroxysmal  nocturnal dyspnea. Dermatological: No rash, lesions/masses Respiratory: No cough, dyspnea Urologic: No hematuria, dysuria Abdominal:   No nausea, vomiting, diarrhea, bright red blood per rectum, melena, or hematemesis Neurologic:  No visual changes, wkns, changes in mental status. All other systems reviewed and are otherwise negative except as noted above.  Physical Exam    VS:  There were no vitals taken for this visit. , BMI There is no height or weight on file to calculate BMI. GEN: Well nourished, well developed, in no acute distress. HEENT: normal. Neck: Supple, no JVD, carotid bruits, or masses. Cardiac: RRR, no murmurs, rubs, or gallops. No clubbing, cyanosis, edema.  Radials/DP/PT 2+ and equal bilaterally.  Respiratory:  Respirations regular and unlabored, clear to auscultation bilaterally. GI: Soft, nontender, nondistended, BS + x 4. MS: no deformity or atrophy. Skin: warm and dry, no rash. Neuro:  Strength and sensation are intact. Psych: Normal affect.  Accessory Clinical Findings    Recent Labs: 08/01/2023: ALT 24   Recent Lipid Panel    Component Value Date/Time   CHOL 105 08/01/2023 1020   TRIG 92 08/01/2023 1020   HDL 31 (L) 08/01/2023 1020   CHOLHDL 3.4 08/01/2023 1020   CHOLHDL 3.7 03/20/2023 0210   VLDL 11 03/20/2023 0210   LDLCALC 56 08/01/2023 1020    No BP recorded.  {Refresh Note OR Click here to enter BP  :1}***    ECG personally reviewed by me today-   ***   Left heart catheterization 03/19/2023 Prox RCA lesion is 90% stenosed.   Prox RCA to Mid RCA lesion is 40% stenosed.   Dist RCA lesion is 75% stenosed.   1st Mrg lesion is 99% stenosed.   Mid LAD lesion is 50% stenosed.   1st Diag lesion is 75% stenosed.   Balloon angioplasty was performed using a BALLN [place] EMERGE MR 4.0X12.   Post intervention, there is a 15% residual stenosis.   The left ventricular systolic function is normal.   LV end diastolic pressure is moderately elevated.   The  left ventricular ejection fraction is greater than 65% by visual estimate.   Recommend uninterrupted dual antiplatelet therapy with Aspirin  81mg  daily and Ticagrelor  90mg  twice daily for a minimum of 12 months (ACS-Class I recommendation).   2 vessel obstructive CAD.  Culprit is the first OM with in stent restenosis and acute thrombus Normal LV function Moderately elevated LVEDP Successful PCI of the first OM with IVUS guidance. Scoring balloon PTCA following by high pressure  balloon.    Plan: DAPT for one year. Anticipate returning on Friday for complex PCI of the RCA   Stage intervention 03/21/2023   Prox RCA lesion is 90% stenosed.   Dist RCA lesion is 75% stenosed.   Prox RCA to Mid RCA lesion is 40% stenosed.   A drug-eluting stent was successfully placed using a SYNERGY XD 3.0X28.   A drug-eluting stent was successfully placed using a SYNERGY XD 3.50X20.   Post intervention, there is a 0% residual stenosis.   Post intervention, there is a 0% residual stenosis.   Post intervention, there is a 40% residual stenosis.   Recommend uninterrupted dual antiplatelet therapy with Aspirin  81mg  daily and Ticagrelor  90mg  twice daily for a minimum of 12 months (ACS-Class I recommendation).   Successful PCI of the proximal and distal RCA with IVUS guidance and Shockwave lithotripsy of the proximal lesion DES x 2     Plan; DAPT for  at least one year. Anticipate DC in am   Diagnostic Dominance: Right  Intervention      Diagnostic Dominance: Right  Intervention      Echocardiogram 03/20/2023 1. Left ventricular ejection fraction, by estimation, is 55 to 60%. The  left ventricle has normal function. The left ventricle demonstrates global  hypokinesis. Left ventricular diastolic parameters are consistent with  Grade I diastolic dysfunction  (impaired relaxation).   2. Right ventricular systolic function is normal. The right ventricular  size is normal.   3. Left atrial size was  mildly dilated.   4. The mitral valve is normal in structure. Mild mitral valve  regurgitation. No evidence of mitral stenosis.   5. The aortic valve is calcified. There is moderate calcification of the  aortic valve. Aortic valve regurgitation is not visualized. Aortic valve  sclerosis is present, with no evidence of aortic valve stenosis. Aortic  valve mean gradient measures 4.0  mmHg. Aortic valve Vmax measures 1.32 m/s.   6. The inferior vena cava is normal in size with greater than 50%  respiratory variability, suggesting right atrial pressure of 3 mmHg     Assessment & Plan   1.  Coronary artery disease-denies exertional chest discomfort.  Underwent staged PCI on 03/19/2023 and then on 03/21/2023.  Details above. Continue  aspirin , Plavix , Imdur , atorvastatin , ezetimibe , metoprolol  Heart healthy low-sodium diet Maintain physical activity  Hyperlipidemia- 08/01/2023: Cholesterol, Total 105; HDL 31; LDL Chol Calc (NIH) 56; Triglycerides 92.  Previously was not able to afford Repatha . High-fiber diet Continue atorvastatin , aspirin , ezetimibe   Essential hypertension-BP today 118/8***2. Continue Imdur , metoprolol  Heart healthy low-sodium diet  Disposition: Follow-up with Dr. Pietro or me in 6-9 months.   Aaron HERO. Keone Kamer NP-C     03/28/2024, 1:46 PM [facility] [address] Office [phone] Fax [phone]    I spent 14*** minutes examining this patient, reviewing medications, and using patient centered shared decision making involving their cardiac care.   I spent  20 minutes reviewing their past medical history,  medications, and prior cardiac tests.  "

## 2024-03-29 MED ORDER — METOPROLOL SUCCINATE ER 50 MG PO TB24: 50.0000 mg | ORAL_TABLET | Freq: Every day | ORAL | 3 refills | Status: DC

## 2024-03-29 MED ORDER — CLOPIDOGREL BISULFATE 75 MG PO TABS: 75.0000 mg | ORAL_TABLET | Freq: Every day | ORAL | 3 refills | Status: DC

## 2024-03-29 MED ORDER — MEMANTINE HCL 10 MG PO TABS: 10.0000 mg | ORAL_TABLET | Freq: Two times a day (BID) | ORAL | 1 refills | Status: AC

## 2024-03-29 MED ORDER — DONEPEZIL HCL 10 MG PO TABS: 10.0000 mg | ORAL_TABLET | Freq: Every day | ORAL | 1 refills | Status: AC

## 2024-03-29 MED ORDER — ISOSORBIDE MONONITRATE ER 30 MG PO TB24: 15.0000 mg | ORAL_TABLET | Freq: Every day | ORAL | 3 refills | Status: AC

## 2024-03-29 MED ORDER — EZETIMIBE 10 MG PO TABS: 10.0000 mg | ORAL_TABLET | Freq: Every day | ORAL | 3 refills | Status: AC

## 2024-03-29 NOTE — Telephone Encounter (Signed)
 Last seen on 05/22/23 per note  Continue Aricept  10 mg daily, Namenda  10 mg twice a day  No follow up scheduled

## 2024-03-30 ENCOUNTER — Ambulatory Visit: Admitting: General Practice

## 2024-04-01 MED ORDER — METOPROLOL SUCCINATE ER 50 MG PO TB24: 50.0000 mg | ORAL_TABLET | Freq: Every day | ORAL | 0 refills | Status: DC

## 2024-04-01 NOTE — Addendum Note (Signed)
 Addended by: BETHENA MOATS R on: 04/01/2024 04:09 PM   Modules accepted: Orders

## 2024-04-09 ENCOUNTER — Other Ambulatory Visit: Payer: Self-pay | Admitting: Cardiology

## 2024-04-12 ENCOUNTER — Other Ambulatory Visit: Payer: Self-pay | Admitting: Cardiology

## 2024-04-17 ENCOUNTER — Other Ambulatory Visit: Payer: Self-pay | Admitting: Cardiology

## 2024-04-18 NOTE — Telephone Encounter (Signed)
 Checked [facility] DXA, patient had CBC and CMET on 12/16/23. Hgb, Hct, and creatinine within normal limits.
